# Patient Record
Sex: Female | Born: 1985 | Race: Black or African American | Hispanic: No | Marital: Married | Smoking: Never smoker
Health system: Southern US, Community
[De-identification: ages and names within clinical notes are randomized; demographics above are authoritative.]

## PROBLEM LIST (undated history)

## (undated) DIAGNOSIS — R87629 Unspecified abnormal cytological findings in specimens from vagina: Secondary | ICD-10-CM

## (undated) DIAGNOSIS — Z789 Other specified health status: Secondary | ICD-10-CM

## (undated) HISTORY — PX: NO PAST SURGERIES: SHX2092

## (undated) HISTORY — DX: Unspecified abnormal cytological findings in specimens from vagina: R87.629

## (undated) HISTORY — PX: OTHER SURGICAL HISTORY: SHX169

## (undated) HISTORY — DX: Other specified health status: Z78.9

## (undated) HISTORY — PX: WISDOM TOOTH EXTRACTION: SHX21

---

## 2017-04-06 ENCOUNTER — Encounter: Payer: Self-pay | Admitting: Family Medicine

## 2017-04-07 ENCOUNTER — Ambulatory Visit (INDEPENDENT_AMBULATORY_CARE_PROVIDER_SITE_OTHER): Payer: 59 | Admitting: Physician Assistant

## 2017-04-07 ENCOUNTER — Encounter: Payer: Self-pay | Admitting: Physician Assistant

## 2017-04-07 VITALS — BP 122/73 | HR 85 | Temp 98.3°F | Resp 17 | Ht 66.5 in | Wt 222.1 lb

## 2017-04-07 DIAGNOSIS — Z13228 Encounter for screening for other metabolic disorders: Secondary | ICD-10-CM

## 2017-04-07 DIAGNOSIS — Z13220 Encounter for screening for lipoid disorders: Secondary | ICD-10-CM | POA: Diagnosis not present

## 2017-04-07 DIAGNOSIS — Z803 Family history of malignant neoplasm of breast: Secondary | ICD-10-CM

## 2017-04-07 DIAGNOSIS — Z1329 Encounter for screening for other suspected endocrine disorder: Secondary | ICD-10-CM

## 2017-04-07 DIAGNOSIS — Z130 Encounter for screening for diseases of the blood and blood-forming organs and certain disorders involving the immune mechanism: Secondary | ICD-10-CM

## 2017-04-07 DIAGNOSIS — Z0000 Encounter for general adult medical examination without abnormal findings: Secondary | ICD-10-CM | POA: Diagnosis not present

## 2017-04-07 DIAGNOSIS — Z30011 Encounter for initial prescription of contraceptive pills: Secondary | ICD-10-CM

## 2017-04-07 DIAGNOSIS — Z124 Encounter for screening for malignant neoplasm of cervix: Secondary | ICD-10-CM | POA: Diagnosis not present

## 2017-04-07 LAB — POCT URINALYSIS DIP (MANUAL ENTRY)
Bilirubin, UA: NEGATIVE
Blood, UA: NEGATIVE
Glucose, UA: NEGATIVE mg/dL
Ketones, POC UA: NEGATIVE mg/dL
Leukocytes, UA: NEGATIVE
Nitrite, UA: NEGATIVE
Protein Ur, POC: NEGATIVE mg/dL
Spec Grav, UA: 1.01 (ref 1.010–1.025)
Urobilinogen, UA: 0.2 E.U./dL
pH, UA: 8.5 — AB (ref 5.0–8.0)

## 2017-04-07 LAB — POCT URINE PREGNANCY: Preg Test, Ur: NEGATIVE

## 2017-04-07 MED ORDER — NORETHINDRONE 0.35 MG PO TABS: 1.0000 | ORAL_TABLET | Freq: Every day | ORAL | 0 refills | Status: DC

## 2017-04-07 NOTE — Patient Instructions (Addendum)
Please see if your mother was tested for BRCA1 and BRCA2. I have given you a referral to oncology and they will decide if you need genetic testing and when you should start mammogram screening.   For birth control, start taking micronor tablets daily. You should take these at the same time each day for them to be the most effective. In the meantime, contact your insurance and tell them you are wanting to have the nexplanon placed. Ask them how they go about getting it to your primary care office as we do not have them in stock. They will most likely send it to your pharmacy and then you will pick it up. Once you do this, contact our office and make an appointment with either PA Weber, PA English, or Dr. Nolon Rod to have it placed.   It was nice meeting you today. We will contact you with your lab results and I will fax that sheet to your work. Please sign up for mychart in the meantime so we can communicate through that.    Health Maintenance, Female Adopting a healthy lifestyle and getting preventive care can go a long way to promote health and wellness. Talk with your health care provider about what schedule of regular examinations is right for you. This is a good chance for you to check in with your provider about disease prevention and staying healthy. In between checkups, there are plenty of things you can do on your own. Experts have done a lot of research about which lifestyle changes and preventive measures are most likely to keep you healthy. Ask your health care provider for more information. Weight and diet Eat a healthy diet  Be sure to include plenty of vegetables, fruits, low-fat dairy products, and lean protein.  Do not eat a lot of foods high in solid fats, added sugars, or salt.  Get regular exercise. This is one of the most important things you can do for your health.  Most adults should exercise for at least 150 minutes each week. The exercise should increase your heart rate  and make you sweat (moderate-intensity exercise).  Most adults should also do strengthening exercises at least twice a week. This is in addition to the moderate-intensity exercise. Maintain a healthy weight  Body mass index (BMI) is a measurement that can be used to identify possible weight problems. It estimates body fat based on height and weight. Your health care provider can help determine your BMI and help you achieve or maintain a healthy weight.  For females 91 years of age and older:  A BMI below 18.5 is considered underweight.  A BMI of 18.5 to 24.9 is normal.  A BMI of 25 to 29.9 is considered overweight.  A BMI of 30 and above is considered obese. Watch levels of cholesterol and blood lipids  You should start having your blood tested for lipids and cholesterol at 30 years of age, then have this test every 5 years.  You may need to have your cholesterol levels checked more often if:  Your lipid or cholesterol levels are high.  You are older than 30 years of age.  You are at high risk for heart disease. Cancer screening Lung Cancer  Lung cancer screening is recommended for adults 49-45 years old who are at high risk for lung cancer because of a history of smoking.  A yearly low-dose CT scan of the lungs is recommended for people who:  Currently smoke.  Have quit within the past  15 years.  Have at least a 30-pack-year history of smoking. A pack year is smoking an average of one pack of cigarettes a day for 1 year.  Yearly screening should continue until it has been 15 years since you quit.  Yearly screening should stop if you develop a health problem that would prevent you from having lung cancer treatment. Breast Cancer  Practice breast self-awareness. This means understanding how your breasts normally appear and feel.  It also means doing regular breast self-exams. Let your health care provider know about any changes, no matter how small.  If you are in your  20s or 30s, you should have a clinical breast exam (CBE) by a health care provider every 1-3 years as part of a regular health exam.  If you are 2 or older, have a CBE every year. Also consider having a breast X-ray (mammogram) every year.  If you have a family history of breast cancer, talk to your health care provider about genetic screening.  If you are at high risk for breast cancer, talk to your health care provider about having an MRI and a mammogram every year.  Breast cancer gene (BRCA) assessment is recommended for women who have family members with BRCA-related cancers. BRCA-related cancers include:  Breast.  Ovarian.  Tubal.  Peritoneal cancers.  Results of the assessment will determine the need for genetic counseling and BRCA1 and BRCA2 testing. Cervical Cancer  Your health care provider may recommend that you be screened regularly for cancer of the pelvic organs (ovaries, uterus, and vagina). This screening involves a pelvic examination, including checking for microscopic changes to the surface of your cervix (Pap test). You may be encouraged to have this screening done every 3 years, beginning at age 65.  For women ages 69-65, health care providers may recommend pelvic exams and Pap testing every 3 years, or they may recommend the Pap and pelvic exam, combined with testing for human papilloma virus (HPV), every 5 years. Some types of HPV increase your risk of cervical cancer. Testing for HPV may also be done on women of any age with unclear Pap test results.  Other health care providers may not recommend any screening for nonpregnant women who are considered low risk for pelvic cancer and who do not have symptoms. Ask your health care provider if a screening pelvic exam is right for you.  If you have had past treatment for cervical cancer or a condition that could lead to cancer, you need Pap tests and screening for cancer for at least 20 years after your treatment. If Pap  tests have been discontinued, your risk factors (such as having a new sexual partner) need to be reassessed to determine if screening should resume. Some women have medical problems that increase the chance of getting cervical cancer. In these cases, your health care provider may recommend more frequent screening and Pap tests. Colorectal Cancer  This type of cancer can be detected and often prevented.  Routine colorectal cancer screening usually begins at 30 years of age and continues through 30 years of age.  Your health care provider may recommend screening at an earlier age if you have risk factors for colon cancer.  Your health care provider may also recommend using home test kits to check for hidden blood in the stool.  A small camera at the end of a tube can be used to examine your colon directly (sigmoidoscopy or colonoscopy). This is done to check for the earliest forms of  colorectal cancer.  Routine screening usually begins at age 86.  Direct examination of the colon should be repeated every 5-10 years through 30 years of age. However, you may need to be screened more often if early forms of precancerous polyps or small growths are found. Skin Cancer  Check your skin from head to toe regularly.  Tell your health care provider about any new moles or changes in moles, especially if there is a change in a mole's shape or color.  Also tell your health care provider if you have a mole that is larger than the size of a pencil eraser.  Always use sunscreen. Apply sunscreen liberally and repeatedly throughout the day.  Protect yourself by wearing long sleeves, pants, a wide-brimmed hat, and sunglasses whenever you are outside. Heart disease, diabetes, and high blood pressure  High blood pressure causes heart disease and increases the risk of stroke. High blood pressure is more likely to develop in:  People who have blood pressure in the high end of the normal range (130-139/85-89 mm  Hg).  People who are overweight or obese.  People who are African American.  If you are 67-67 years of age, have your blood pressure checked every 3-5 years. If you are 31 years of age or older, have your blood pressure checked every year. You should have your blood pressure measured twice-once when you are at a hospital or clinic, and once when you are not at a hospital or clinic. Record the average of the two measurements. To check your blood pressure when you are not at a hospital or clinic, you can use:  An automated blood pressure machine at a pharmacy.  A home blood pressure monitor.  If you are between 57 years and 1 years old, ask your health care provider if you should take aspirin to prevent strokes.  Have regular diabetes screenings. This involves taking a blood sample to check your fasting blood sugar level.  If you are at a normal weight and have a low risk for diabetes, have this test once every three years after 30 years of age.  If you are overweight and have a high risk for diabetes, consider being tested at a younger age or more often. Preventing infection Hepatitis B  If you have a higher risk for hepatitis B, you should be screened for this virus. You are considered at high risk for hepatitis B if:  You were born in a country where hepatitis B is common. Ask your health care provider which countries are considered high risk.  Your parents were born in a high-risk country, and you have not been immunized against hepatitis B (hepatitis B vaccine).  You have HIV or AIDS.  You use needles to inject street drugs.  You live with someone who has hepatitis B.  You have had sex with someone who has hepatitis B.  You get hemodialysis treatment.  You take certain medicines for conditions, including cancer, organ transplantation, and autoimmune conditions. Hepatitis C  Blood testing is recommended for:  Everyone born from [birth year] through 1964.  Anyone with known  risk factors for hepatitis C. Sexually transmitted infections (STIs)  You should be screened for sexually transmitted infections (STIs) including gonorrhea and chlamydia if:  You are sexually active and are younger than 30 years of age.  You are older than 30 years of age and your health care provider tells you that you are at risk for this type of infection.  Your sexual activity has  changed since you were last screened and you are at an increased risk for chlamydia or gonorrhea. Ask your health care provider if you are at risk.  If you do not have HIV, but are at risk, it may be recommended that you take a prescription medicine daily to prevent HIV infection. This is called pre-exposure prophylaxis (PrEP). You are considered at risk if:  You are sexually active and do not regularly use condoms or know the HIV status of your partner(s).  You take drugs by injection.  You are sexually active with a partner who has HIV. Talk with your health care provider about whether you are at high risk of being infected with HIV. If you choose to begin PrEP, you should first be tested for HIV. You should then be tested every 3 months for as long as you are taking PrEP. Pregnancy  If you are premenopausal and you may become pregnant, ask your health care provider about preconception counseling.  If you may become pregnant, take 400 to 800 micrograms (mcg) of folic acid every day.  If you want to prevent pregnancy, talk to your health care provider about birth control (contraception). Osteoporosis and menopause  Osteoporosis is a disease in which the bones lose minerals and strength with aging. This can result in serious bone fractures. Your risk for osteoporosis can be identified using a bone density scan.  If you are 68 years of age or older, or if you are at risk for osteoporosis and fractures, ask your health care provider if you should be screened.  Ask your health care provider whether you  should take a calcium or vitamin D supplement to lower your risk for osteoporosis.  Menopause may have certain physical symptoms and risks.  Hormone replacement therapy may reduce some of these symptoms and risks. Talk to your health care provider about whether hormone replacement therapy is right for you. Follow these instructions at home:  Schedule regular health, dental, and eye exams.  Stay current with your immunizations.  Do not use any tobacco products including cigarettes, chewing tobacco, or electronic cigarettes.  If you are pregnant, do not drink alcohol.  If you are breastfeeding, limit how much and how often you drink alcohol.  Limit alcohol intake to no more than 1 drink per day for nonpregnant women. One drink equals 12 ounces of beer, 5 ounces of wine, or 1 ounces of hard liquor.  Do not use street drugs.  Do not share needles.  Ask your health care provider for help if you need support or information about quitting drugs.  Tell your health care provider if you often feel depressed.  Tell your health care provider if you have ever been abused or do not feel safe at home. This information is not intended to replace advice given to you by your health care provider. Make sure you discuss any questions you have with your health care provider. Document Released: 05/15/2011 Document Revised: 04/06/2016 Document Reviewed: 08/03/2015 Elsevier Interactive Patient Education  2017 [employer].   IF you received an x-ray today, you will receive an invoice from [facility]. Please contact [facility] at [phone] with questions or concerns regarding your invoice.   IF you received labwork today, you will receive an invoice from [facility]. Please contact [facility] at [phone] with questions or concerns regarding your invoice.   Our billing staff will not be able to assist you with questions regarding bills from these companies.  You will be  contacted with  the lab results as soon as they are available. The fastest way to get your results is to activate your My Chart account. Instructions are located on the last page of this paperwork. If you have not heard from [place] regarding the results in 2 weeks, please contact this office.

## 2017-04-07 NOTE — Progress Notes (Signed)
Sara Barrett  MRN: [id number] DOB: 1986-05-02  Subjective:  Pt is a 31 y.o. female who presents for annual physical exam.   Soical: Works at [employer]. She just moved here from [place] but is from [facility]. She is engaged and has one 73.50 year old son.  Diet: Has well balanced meal. Eats fruits, veggies, grains, yogurt, and almond milk. Does have a sweet tooth. Drinks mostly water, some juice. Does not take a multiviatmin daily.  Exercise: Walks daily and her job is strenuous but does not do structured exercise frequently as she has been so busy.   Menstrual cycles: LMP 03/21/17. First cycle after her child as she was breastfeeding. Notes she used to take depo before her child. She wants to try nexplanon at this time as she is not planning on getting pregnant at least for 3 years. Denies personal hx clots, strokes, and migraines. Has no FH of strokes or blood clots. Denies smoking.   Bowel movement: Typically goes daily, regular for her.   Last dental exam: 2018 Last vision exam: 2017 Last pap smear: Cannot remember Last mammogram: Never, but would like to discuss when she should start screening mammograms as her mother was dx with breast cancer at age 3. She passed away five years after dx due to MI. Pt is unaware if her mother had genetic testing as she was very young when this occurred. Was told by a doctor in [place] that she would need to start screening 10 years prior to the age her mother was diagnosed.   Vaccinations      Tetanus: 2016      HPV: Completed   There are no active problems to display for this patient.   No current outpatient prescriptions on file prior to visit.   No current facility-administered medications on file prior to visit.     No Known Allergies  Social History   Social History  . Marital status: Single    Spouse name: N/A  . Number of children: N/A  . Years of education: N/A   Social History Main Topics  . Smoking  status: Never Smoker  . Smokeless tobacco: Never Used  . Alcohol use Yes     Comment: rarely  . Drug use: No  . Sexual activity: Yes    Birth control/ protection: None   Other Topics Concern  . None   Social History Narrative  . None    No past surgical history on file.  Family History  Problem Relation Age of Onset  . Cancer Mother 51  . Heart disease Mother   . Diabetes Father   . Diabetes Brother     Review of Systems  Constitutional: Negative for activity change, appetite change, chills, diaphoresis, fatigue, fever and unexpected weight change.  HENT: Positive for congestion. Negative for dental problem, drooling, ear discharge, ear pain, facial swelling, hearing loss, mouth sores, nosebleeds, postnasal drip, rhinorrhea, sinus pain, sinus pressure, sneezing, sore throat, tinnitus, trouble swallowing and voice change.   Eyes: Negative for photophobia, pain, discharge, redness, itching and visual disturbance.  Respiratory: Negative for apnea, cough, choking, chest tightness, shortness of breath, wheezing and stridor.   Cardiovascular: Negative for chest pain, palpitations and leg swelling.  Gastrointestinal: Negative for abdominal distention, abdominal pain, anal bleeding, blood in stool, constipation, diarrhea, nausea, rectal pain and vomiting.  Endocrine: Negative for cold intolerance, heat intolerance, polydipsia, polyphagia and polyuria.  Genitourinary: Negative for decreased urine volume, difficulty urinating, dyspareunia, dysuria, enuresis, flank pain,  frequency, genital sores, hematuria, menstrual problem, pelvic pain, urgency, vaginal bleeding, vaginal discharge and vaginal pain.  Musculoskeletal: Negative for arthralgias, back pain, gait problem, joint swelling, myalgias, neck pain and neck stiffness.  Skin: Negative for color change, pallor, rash and wound.  Allergic/Immunologic: Negative for environmental allergies, food allergies and immunocompromised state.    Neurological: Negative for dizziness, tremors, seizures, syncope, facial asymmetry, speech difficulty, weakness, light-headedness, numbness and headaches.  Hematological: Negative for adenopathy. Does not bruise/bleed easily.  Psychiatric/Behavioral: Negative for agitation, behavioral problems, confusion, decreased concentration, dysphoric mood, hallucinations, self-injury, sleep disturbance and suicidal ideas. The patient is not nervous/anxious and is not hyperactive.     Objective:  BP 122/73   Pulse 85   Temp 98.3 F (36.8 C) (Oral)   Resp 17   Ht 5' 6.5" (1.689 m)   Wt 222 lb 2 oz (100.8 kg)   LMP 03/21/2017 (Exact Date)   SpO2 100%   BMI 35.31 kg/m   Physical Exam  Constitutional: She is oriented to person, place, and time and well-developed, well-nourished, and in no distress.  HENT:  Head: Normocephalic and atraumatic.  Right Ear: Hearing, tympanic membrane, external ear and ear canal normal.  Left Ear: Hearing, tympanic membrane, external ear and ear canal normal.  Nose: Nose normal.  Mouth/Throat: Uvula is midline, oropharynx is clear and moist and mucous membranes are normal. No oropharyngeal exudate.  Eyes: Conjunctivae, EOM and lids are normal. Pupils are equal, round, and reactive to light. No scleral icterus.  Neck: Trachea normal and normal range of motion. No thyroid mass and no thyromegaly present.  Cardiovascular: Normal rate, regular rhythm, normal heart sounds and intact distal pulses.   Pulmonary/Chest: Effort normal and breath sounds normal. Right breast exhibits no inverted nipple, no mass, no nipple discharge, no skin change and no tenderness. Left breast exhibits no inverted nipple, no mass, no nipple discharge, no skin change and no tenderness. Breasts are symmetrical.  Abdominal: Soft. Normal appearance and bowel sounds are normal. There is no tenderness.  Genitourinary: Vagina normal, uterus normal, cervix normal, right adnexa normal, left adnexa normal and  vulva normal.  Lymphadenopathy:       Head (right side): No tonsillar, no preauricular, no posterior auricular and no occipital adenopathy present.       Head (left side): No tonsillar, no preauricular, no posterior auricular and no occipital adenopathy present.    She has no cervical adenopathy.       Right: No supraclavicular adenopathy present.       Left: No supraclavicular adenopathy present.  Neurological: She is alert and oriented to person, place, and time. She has normal sensation, normal strength and normal reflexes. Gait normal.  Skin: Skin is warm and dry.  Psychiatric: Affect normal.    Visual Acuity Screening   Right eye Left eye Both eyes  Without correction:     With correction: '20/15 20/20 20/15 '   Results for orders placed or performed in visit on 04/07/17 (from the past 48 hour(s))  CBC with Differential/Platelet     Status: None   Collection Time: 04/07/17 10:21 AM  Result Value Ref Range   WBC 5.7 3.4 - 10.8 x10E3/uL   RBC 4.80 3.77 - 5.28 x10E6/uL   Hemoglobin 13.5 11.1 - 15.9 g/dL   Hematocrit 42.2 34.0 - 46.6 %   MCV 88 79 - 97 fL   MCH 28.1 26.6 - 33.0 pg   MCHC 32.0 31.5 - 35.7 g/dL   RDW 13.1 12.3 -  15.4 %   Platelets 249 150 - 379 x10E3/uL   Neutrophils 64 Not Estab. %   Lymphs 27 Not Estab. %   Monocytes 8 Not Estab. %   Eos 1 Not Estab. %   Basos 0 Not Estab. %   Neutrophils Absolute 3.7 1.4 - 7.0 x10E3/uL   Lymphocytes Absolute 1.5 0.7 - 3.1 x10E3/uL   Monocytes Absolute 0.5 0.1 - 0.9 x10E3/uL   EOS (ABSOLUTE) 0.0 0.0 - 0.4 x10E3/uL   Basophils Absolute 0.0 0.0 - 0.2 x10E3/uL   Immature Granulocytes 0 Not Estab. %   Immature Grans (Abs) 0.0 0.0 - 0.1 x10E3/uL  CMP14+EGFR     Status: None   Collection Time: 04/07/17 10:21 AM  Result Value Ref Range   Glucose 80 65 - 99 mg/dL   BUN 8 6 - 20 mg/dL   Creatinine, Ser 0.79 0.57 - 1.00 mg/dL   GFR calc non Af Amer 101 >59 mL/min/1.73   GFR calc Af Amer 117 >59 mL/min/1.73   BUN/Creatinine Ratio  10 9 - 23   Sodium 138 134 - 144 mmol/L   Potassium 4.7 3.5 - 5.2 mmol/L   Chloride 103 96 - 106 mmol/L   CO2 23 18 - 29 mmol/L    Comment: **Effective April 23, 2017 Carbon Dioxide, Total**   reference interval will be changing to:              Age                  Female          Female      0 days   - 30 days         13 - 52        16 - 63     31 days   -  1 year         15 - 25        15 - 25      2 years  -  5 years        60 - 80        17 - 91      6 years  - 12 years        57 - 28        19 - 42                >12 years        32 - 92        20 - 29    Calcium 9.1 8.7 - 10.2 mg/dL   Total Protein 6.7 6.0 - 8.5 g/dL   Albumin 4.3 3.5 - 5.5 g/dL   Globulin, Total 2.4 1.5 - 4.5 g/dL   Albumin/Globulin Ratio 1.8 1.2 - 2.2   Bilirubin Total 0.4 0.0 - 1.2 mg/dL   Alkaline Phosphatase 70 39 - 117 IU/L   AST 21 0 - 40 IU/L   ALT 19 0 - 32 IU/L  Lipid panel     Status: None   Collection Time: 04/07/17 10:21 AM  Result Value Ref Range   Cholesterol, Total 127 100 - 199 mg/dL   Triglycerides 43 0 - 149 mg/dL   HDL 58 >39 mg/dL   VLDL Cholesterol Cal 9 5 - 40 mg/dL   LDL Calculated 60 0 - 99 mg/dL   Chol/HDL Ratio 2.2 0.0 - 4.4 ratio    Comment:  T. Chol/HDL Ratio                                             Men  Women                               1/2 Avg.Risk  3.4    3.3                                   Avg.Risk  5.0    4.4                                2X Avg.Risk  9.6    7.1                                3X Avg.Risk 23.4   11.0   TSH     Status: None   Collection Time: 04/07/17 10:21 AM  Result Value Ref Range   TSH 1.120 0.450 - 4.500 uIU/mL  POCT urine pregnancy     Status: None   Collection Time: 04/07/17 11:10 AM  Result Value Ref Range   Preg Test, Ur Negative Negative  POCT urinalysis dipstick     Status: Abnormal   Collection Time: 04/07/17 11:10 AM  Result Value Ref Range   Color, UA yellow yellow   Clarity, UA clear clear    Glucose, UA negative negative mg/dL   Bilirubin, UA negative negative   Ketones, POC UA negative negative mg/dL   Spec Grav, UA 1.010 1.010 - 1.025   Blood, UA negative negative   pH, UA 8.5 (A) 5.0 - 8.0   Protein Ur, POC negative negative mg/dL   Urobilinogen, UA 0.2 0.2 or 1.0 E.U./dL   Nitrite, UA Negative Negative   Leukocytes, UA Negative Negative     Assessment and Plan :  Discussed healthy lifestyle, diet, exercise, preventative care, vaccinations, and addressed patient's concerns. Plan for follow up for nexplanon placement. Otherwise, plan for specific conditions below.  1. Annual physical exam Await lab results   2. Screening, anemia, deficiency, iron - CBC with Differential/Platelet  3. Screening for endocrine, metabolic and immunity disorder - CMP14+EGFR  4. Screening, lipid - Lipid panel  5. Screening for thyroid disorder - TSH  6. Screening for cervical cancer - Pap IG, CT/NG w/ reflex HPV when ASC-U  7. Family history of breast cancer in mother - Ambulatory referral to Oncology  8. Oral contraception initiation Pt informed on how to go about getting the nexplanon placed in our office. She will have to contact her insurance and let them know she is wanting the nexplanon. Once she receives it, she will make an appointment in our office for nexplanon placement. I have given her 3 months of Rx for OCP to take while going through this process. Encouraged to take tablets daily at the same time for maximum effect.  - norethindrone (ORTHO MICRONOR) 0.35 MG tablet; Take 1 tablet (0.35 mg total) by mouth daily.  Dispense: 3 Package; Refill: 0 - POCT urine pregnancy - POCT urinalysis dipstick   Tenna Delaine PA-C  [facility] 04/08/2017 11:12 AM

## 2017-04-08 LAB — LIPID PANEL
Chol/HDL Ratio: 2.2 ratio (ref 0.0–4.4)
Cholesterol, Total: 127 mg/dL (ref 100–199)
HDL: 58 mg/dL (ref >39)
LDL Calculated: 60 mg/dL (ref 0–99)
Triglycerides: 43 mg/dL (ref 0–149)
VLDL Cholesterol Cal: 9 mg/dL (ref 5–40)

## 2017-04-08 LAB — CMP14+EGFR
ALT: 19 IU/L (ref 0–32)
AST: 21 IU/L (ref 0–40)
Albumin/Globulin Ratio: 1.8 (ref 1.2–2.2)
Albumin: 4.3 g/dL (ref 3.5–5.5)
Alkaline Phosphatase: 70 IU/L (ref 39–117)
BUN/Creatinine Ratio: 10 (ref 9–23)
BUN: 8 mg/dL (ref 6–20)
Bilirubin Total: 0.4 mg/dL (ref 0.0–1.2)
CO2: 23 mmol/L (ref 18–29)
Calcium: 9.1 mg/dL (ref 8.7–10.2)
Chloride: 103 mmol/L (ref 96–106)
Creatinine, Ser: 0.79 mg/dL (ref 0.57–1.00)
GFR calc Af Amer: 117 mL/min/{1.73_m2} (ref >59)
GFR calc non Af Amer: 101 mL/min/{1.73_m2} (ref >59)
Globulin, Total: 2.4 g/dL (ref 1.5–4.5)
Glucose: 80 mg/dL (ref 65–99)
Potassium: 4.7 mmol/L (ref 3.5–5.2)
Sodium: 138 mmol/L (ref 134–144)
Total Protein: 6.7 g/dL (ref 6.0–8.5)

## 2017-04-08 LAB — CBC WITH DIFFERENTIAL/PLATELET
Basophils Absolute: 0 10*3/uL (ref 0.0–0.2)
Basos: 0 %
EOS (ABSOLUTE): 0 10*3/uL (ref 0.0–0.4)
Eos: 1 %
Hematocrit: 42.2 % (ref 34.0–46.6)
Hemoglobin: 13.5 g/dL (ref 11.1–15.9)
Immature Grans (Abs): 0 10*3/uL (ref 0.0–0.1)
Immature Granulocytes: 0 %
Lymphocytes Absolute: 1.5 10*3/uL (ref 0.7–3.1)
Lymphs: 27 %
MCH: 28.1 pg (ref 26.6–33.0)
MCHC: 32 g/dL (ref 31.5–35.7)
MCV: 88 fL (ref 79–97)
Monocytes Absolute: 0.5 10*3/uL (ref 0.1–0.9)
Monocytes: 8 %
Neutrophils Absolute: 3.7 10*3/uL (ref 1.4–7.0)
Neutrophils: 64 %
Platelets: 249 10*3/uL (ref 150–379)
RBC: 4.8 x10E6/uL (ref 3.77–5.28)
RDW: 13.1 % (ref 12.3–15.4)
WBC: 5.7 10*3/uL (ref 3.4–10.8)

## 2017-04-08 LAB — TSH: TSH: 1.12 u[IU]/mL (ref 0.450–4.500)

## 2017-04-12 LAB — PAP IG, CT-NG, RFX HPV ASCU
Chlamydia, Nuc. Acid Amp: NEGATIVE
Gonococcus by Nucleic Acid Amp: NEGATIVE
PAP Smear Comment: 0

## 2017-04-19 ENCOUNTER — Telehealth: Payer: Self-pay | Admitting: Physician Assistant

## 2017-04-19 NOTE — Telephone Encounter (Signed)
Informed patient that I have completed biometric form and placed it in the "to be faxed" box.

## 2017-05-21 ENCOUNTER — Telehealth: Payer: Self-pay | Admitting: Genetic Counselor

## 2017-05-21 ENCOUNTER — Encounter: Payer: Self-pay | Admitting: Genetic Counselor

## 2017-05-21 NOTE — Telephone Encounter (Signed)
Appt has been scheduled for the pt to see Clydie BraunKaren on 7/23 at 10am. Pt agreed to the appt date and time. Letter mailed to the pt.

## 2017-06-04 ENCOUNTER — Encounter: Payer: Self-pay | Admitting: Genetic Counselor

## 2017-06-04 ENCOUNTER — Other Ambulatory Visit: Payer: Self-pay

## 2018-06-13 DIAGNOSIS — Z0000 Encounter for general adult medical examination without abnormal findings: Secondary | ICD-10-CM | POA: Insufficient documentation

## 2018-06-19 DIAGNOSIS — Z1231 Encounter for screening mammogram for malignant neoplasm of breast: Secondary | ICD-10-CM | POA: Insufficient documentation

## 2020-11-10 ENCOUNTER — Telehealth: Payer: Self-pay

## 2020-11-10 ENCOUNTER — Encounter: Payer: Self-pay | Admitting: Advanced Practice Midwife

## 2020-11-10 ENCOUNTER — Other Ambulatory Visit: Payer: Self-pay

## 2020-11-10 ENCOUNTER — Ambulatory Visit (LOCAL_COMMUNITY_HEALTH_CENTER): Payer: Self-pay | Admitting: Family Medicine

## 2020-11-10 VITALS — BP 115/70 | HR 85 | Ht 66.5 in | Wt 241.8 lb

## 2020-11-10 DIAGNOSIS — Z3009 Encounter for other general counseling and advice on contraception: Secondary | ICD-10-CM

## 2020-11-10 DIAGNOSIS — Z3046 Encounter for surveillance of implantable subdermal contraceptive: Secondary | ICD-10-CM

## 2020-11-10 NOTE — Telephone Encounter (Signed)
Call to client ~ 1300 and left message requesting she return call regarding appt this pm. Client returned call and counseled that physical recommended as no physical previously at [facility] (which she verified). Per client, physical declined and only wants Nexplanon removed as desires pregnancy. Client states has card from 2018 when Nexplanon was inserted and counseled to bring to appt. Client asked if physical required for Nexplanon removal and aware may decline physical. Jossie Ng, RN

## 2020-11-10 NOTE — Progress Notes (Signed)
S: patient in clinic today for nexplanon removal, declines physical exam, lab work and pap.  Patient is desiring pregnancy   O: Nexplanon was placed in 2018   A: Nexplanon to be removed and counseled patient to start prenatal vitamins.   P: Nexplanon Removal Patient identified, informed consent performed, consent signed.   Appropriate time out taken. Nexplanon site identified.  Area prepped in usual sterile fashon. 3 ml of 1% lidocaine with Epinephrine was used to anesthetize the area at the distal end of the implant and along implant site. A small stab incision was made right beside the implant on the distal portion.  The Nexplanon rod was grasped using straight hemostats and removed without difficulty.  There was minimal blood loss. There were no complications.  Steri-strips were applied over the small incision.  A pressure bandage was applied to reduce any bruising.  The patient tolerated the procedure well and was given post procedure instructions.    Counseled patient to take OTC analgesic starting as soon as lidocaine starts to wear off and take regularly for at least 48 hr to decrease discomfort.  Specifically to take with food or milk to decrease stomach upset and for IB 600 mg (3 tablets) every 6 hrs; IB 800 mg (4 tablets) every 8 hrs; or Aleve 2 tablets every 12 hrs.

## 2020-11-10 NOTE — Progress Notes (Signed)
Completion of health history form and physical exam declined. Client declines birth control as desires pregnancy. Condoms given are not for client. Jossie Ng, RN

## 2020-11-13 NOTE — L&D Delivery Note (Signed)
[facility] Delivery Note  Sara Barrett is a 35 y.o. G2P1001 s/p SVD at [redacted]w[redacted]d. She was admitted for SOL.   ROM: 0h 17m with moderate meconium fluid GBS Status: Positive/-- (11/07 0000) Inadequate treatment with precipitous delivery.  Maximum Maternal Temperature: 69F  Labor Progress: Initial SVE: 6.5/90/-2. She then progressed quickly to complete after about one hour with expectant management.     Delivery Date/Time: 11/27 at 2237  Delivery: Called by RN to be standby when patient was anterior lip with Dr. Cherly Hensen on her way. Upon arrival to the room at ~2230, patient had SROM with moderate meconium stained fluid (delivery call made) and then involuntarily pushed. After one push, head delivered L OA. Loose nuchal cord present x1 and delivered through. Shoulder and body delivered in usual fashion. Infant placed on mother's abdomen, dried, suctioned with bulb, and stimulated. Infant then given to awaiting NICU team. Cord clamped x 2 after 1-minute delay, and cut by FOB. Cord blood drawn. Placenta delivered spontaneously with gentle cord traction. Fundus firm with massage and Pitocin. Labia, perineum, vagina, and cervix inspected with no lacerations. Dr. Cherly Hensen arrived after placenta delivery, performed a lower uterine manual sweep with minimal clots retrieved.    Baby Weight: pending  Placenta: 3 vessel, intact. Sent to L&D Complications: None Lacerations: None  EBL: 50 mL Analgesia: None   Infant:  APGAR (1 MIN): 8   APGAR (5 MINS): 9    Leticia Penna, DO  OB Family Medicine Fellow, [facility] for [facility], [facility] 10/09/2021, 10:56 PM

## 2021-03-01 ENCOUNTER — Other Ambulatory Visit: Payer: Self-pay | Admitting: Obstetrics and Gynecology

## 2021-03-01 DIAGNOSIS — O3680X0 Pregnancy with inconclusive fetal viability, not applicable or unspecified: Secondary | ICD-10-CM

## 2021-03-21 ENCOUNTER — Ambulatory Visit
Admission: RE | Admit: 2021-03-21 | Discharge: 2021-03-21 | Disposition: A | Discharge status: Home / Self Care | Payer: Self-pay | Source: Ambulatory Visit | Attending: Obstetrics and Gynecology | Admitting: Obstetrics and Gynecology

## 2021-03-21 ENCOUNTER — Other Ambulatory Visit: Payer: Self-pay | Admitting: Obstetrics and Gynecology

## 2021-03-21 ENCOUNTER — Other Ambulatory Visit: Payer: Self-pay

## 2021-03-21 DIAGNOSIS — O3680X0 Pregnancy with inconclusive fetal viability, not applicable or unspecified: Secondary | ICD-10-CM

## 2021-03-30 LAB — HEPATITIS C ANTIBODY: HCV Ab: NEGATIVE

## 2021-03-30 LAB — OB RESULTS CONSOLE HEPATITIS B SURFACE ANTIGEN: Hepatitis B Surface Ag: NEGATIVE

## 2021-03-30 LAB — OB RESULTS CONSOLE HIV ANTIBODY (ROUTINE TESTING): HIV: NONREACTIVE

## 2021-03-30 LAB — OB RESULTS CONSOLE RUBELLA ANTIBODY, IGM: Rubella: IMMUNE

## 2021-05-04 LAB — OB RESULTS CONSOLE ABO/RH: RH Type: POSITIVE

## 2021-05-04 LAB — OB RESULTS CONSOLE ANTIBODY SCREEN: Antibody Screen: NEGATIVE

## 2021-06-09 ENCOUNTER — Encounter: Payer: Self-pay | Admitting: Obstetrics and Gynecology

## 2021-07-01 ENCOUNTER — Encounter: Payer: Self-pay | Admitting: *Deleted

## 2021-07-04 ENCOUNTER — Other Ambulatory Visit: Payer: Self-pay | Admitting: Obstetrics and Gynecology

## 2021-07-04 DIAGNOSIS — Z363 Encounter for antenatal screening for malformations: Secondary | ICD-10-CM

## 2021-07-05 ENCOUNTER — Encounter: Payer: Self-pay | Admitting: *Deleted

## 2021-07-05 ENCOUNTER — Other Ambulatory Visit: Payer: Self-pay | Admitting: Maternal & Fetal Medicine

## 2021-07-05 ENCOUNTER — Ambulatory Visit (HOSPITAL_BASED_OUTPATIENT_CLINIC_OR_DEPARTMENT_OTHER): Payer: Self-pay

## 2021-07-05 ENCOUNTER — Other Ambulatory Visit: Payer: Self-pay

## 2021-07-05 ENCOUNTER — Ambulatory Visit: Payer: Self-pay | Attending: Obstetrics and Gynecology

## 2021-07-05 ENCOUNTER — Ambulatory Visit: Payer: Self-pay | Admitting: *Deleted

## 2021-07-05 VITALS — BP 127/73 | HR 99

## 2021-07-05 DIAGNOSIS — O358XX0 Maternal care for other (suspected) fetal abnormality and damage, not applicable or unspecified: Secondary | ICD-10-CM

## 2021-07-05 DIAGNOSIS — O99212 Obesity complicating pregnancy, second trimester: Secondary | ICD-10-CM

## 2021-07-05 DIAGNOSIS — Z362 Encounter for other antenatal screening follow-up: Secondary | ICD-10-CM

## 2021-07-05 DIAGNOSIS — Z3689 Encounter for other specified antenatal screening: Secondary | ICD-10-CM

## 2021-07-05 DIAGNOSIS — O35BXX0 Maternal care for other (suspected) fetal abnormality and damage, fetal cardiac anomalies, not applicable or unspecified: Secondary | ICD-10-CM

## 2021-07-05 DIAGNOSIS — O289 Unspecified abnormal findings on antenatal screening of mother: Secondary | ICD-10-CM

## 2021-07-05 DIAGNOSIS — Z315 Encounter for genetic counseling: Secondary | ICD-10-CM

## 2021-07-05 DIAGNOSIS — Z3A24 24 weeks gestation of pregnancy: Secondary | ICD-10-CM

## 2021-07-05 DIAGNOSIS — Z363 Encounter for antenatal screening for malformations: Secondary | ICD-10-CM | POA: Insufficient documentation

## 2021-07-05 NOTE — Progress Notes (Signed)
Cousins, Sheronette Length of Consultation: 40 minutes   Ms. Stegner was referred for genetic counseling at [facility].  The patient was present at this visit alone, but her partner, LeDarius, was present via speaker phone for most of the visit. This letter is a summary of our discussion.  At the time of this visit, a detailed ultrasound was performed.  The gestational age was confirmed to be 24 weeks.  The detailed fetal anatomy was seen and appeared normal, including the structures of the fetal heart.  An echogenic intracardiac focus was noted in the left ventricle of the heart.  The choroid plexus cysts that were reported by her OB were not noted on today's exam.  An echogenic intracardiac focus (EIF) refers to a bright spot in the fetal heart.  They are usually found in the left ventricle, which is one of the lower chambers of the heart.  This finding is thought to occur in at least 3-5% of second trimester ultrasounds as a result of microcalcifications in the papillary muscle of the heart.  While it is most likely a normal variation, some studies have found an association between this finding and chromosome abnormalities.  The current data suggests that as an isolated ultrasound finding, the chance for a chromosome problem is not expected to be higher than the risk from an amniocentesis (1 in 200).    In addition to the EIF, choroid plexus cysts were previously reported.  The choroid plexus is an area in the brain where cerebral spinal fluid, the fluid that bathes the brain and spinal cord, is made. Cysts, or fluid filled sacs, are sometimes found in the choroid plexus of babies both before and after they are born. These cysts are referred to as choroid plexus cysts (CPC).  Some literature suggests that as many as 1 in 50 fetuses (2%) evaluated by ultrasound will show these cysts. The significance of these cysts remains unclear, although it is believed that most are a normal variation of  development. Many studies have been done to see if choroid plexus cysts seen on prenatal ultrasound increase the concern for other problems in the baby. After evaluating these studies, it appears that there may be an increase in the risk for a chromosome condition called trisomy 5.  However, this increase in risk is more significant if other ultrasound markers of chromosome condition are seen, or if a birth defect is identified by ultrasound. The increase in risk is less significant if the choroid plexus cysts are an "isolated" finding at ultrasound.  In the absence of a chromosome condition, choroid plexus cysts are not expected to be harmful to the fetus.    To review, chromosomes are the inherited structures that contain our genes (traits).  Each cell of our body normally has 46 chromosomes, matched up in 23 pairs.  The last pair determines gender and are called the sex chromosomes.  A female has an X and a Y chromosome, while a female has two X chromosomes.  Rarely, when a mother's egg or father's sperm unite, an extra or missing chromosome can be passed on to the baby by mistake.  We discussed examples of such a problem including: Down syndrome (an extra 92) and Edward syndrome (an extra 18), both involving some degree of mental retardation and physical problems.  Before this ultrasound, there was a 1 in approximately 244 chance of having a baby with a chromosome problem based on Ms. Perrone's age.  Now that the echogenic  focus and choroid plexus cyst(s) have been seen, the risk has increased, though exactly by how much is difficult to determine.    We discussed the following prenatal testing options for this pregnancy:  Cell free fetal DNA testing from maternal blood to determine whether or not the baby may have Down syndrome, trisomy 23, or trisomy 54.  This test utilizes a maternal blood sample and DNA sequencing technology to isolate circulating cell free fetal DNA from maternal plasma.  The fetal DNA  can then be analyzed for DNA sequences that are derived from the three most common chromosomes involved in aneuploidy, chromosomes 13, 18, and 21.  If the overall amount of DNA is greater than the expected level for any of these chromosomes, aneuploidy is suspected.  While we do not consider it a replacement for invasive testing and karyotype analysis, a negative result from this testing would be reassuring, though not a guarantee of a normal chromosome complement for the baby.  An abnormal result is certainly suggestive of an abnormal chromosome complement, though we would still recommend amniocentesis to confirm any findings from this testing.  Maternal Serum Screening (MSS) is a maternal blood test which measures up to four pregnancy proteins.  Currently, this screening test can detect approximately 75% of cases of Down syndrome, 65% of babies with Ramon Dredge syndrome and >80% of neural tube defects (e.g. spina bifida).  Because it does not directly examine the fetal chromosomes or fetus itself, it cannot positively diagnose or rule out these problems.  Amniocentesis involves the removal of a small amount of amniotic fluid from the sac surrounding the fetus with the use of a thin needle inserted through the mother's abdomen and uterus.  Ultrasound guidance is used throughout the procedure.  Fetal cells are directly evaluated and >99.5% of chromosome problems and >98% of neural tube defects can be detected.  The main risks to this procedure are complications leading to miscarriage in less than 1 in 500 cases (0.2%).  Ultrasound can sometimes detect other fetal anatomy problems (e.g. a heart defect) that suggest a chromosome problem; however, this is often not the case.  If the remainder of the fetal anatomy was seen well today, we do not recommend additional imaging of the fetal heart in the future.  Lastly, we mentioned that each pregnancy in the general population has a 2-3% chance for a birth defect that  might not be detected prenatally.  Cystic Fibrosis and Spinal Muscular Atrophy (SMA) screening were also discussed with the patient. Both conditions are recessive, which means that both parents must be carriers in order to have a child with the disease.  Cystic fibrosis (CF) is one of the most common genetic conditions in persons of Caucasian ancestry.  This condition occurs in approximately 1 in 2,500 Caucasian persons and results in thickened secretions in the lungs, digestive, and reproductive systems.  For a baby to be at risk for having CF, both of the parents must be carriers for this condition.  Approximately 1 in 57 Caucasian persons is a carrier for CF.  Current carrier testing looks for the most common mutations in the gene for CF and can detect approximately 90% of carriers in the Caucasian population.  This means that the carrier screening can greatly reduce, but cannot eliminate, the chance for an individual to have a child with CF.  If an individual is found to be a carrier for CF, then carrier testing would be available for the partner. As part of [place]'s newborn screening profile, all babies born in the state of [place] will have a two-tier screening process.  Specimens are first tested to determine the concentration of immunoreactive trypsinogen (IRT).  The top 5% of specimens with the highest IRT values then undergo DNA testing using a panel of over 40 common CF mutations. SMA is a neurodegenerative disorder that leads to atrophy of skeletal muscle and overall weakness.  This condition is also more prevalent in the Caucasian population, with 1 in 40-1 in 60 persons being a carrier and 1 in 6,000-1 in 10,000 children being affected.  There are multiple forms of the disease, with some causing death in infancy to other forms with survival into adulthood.  The genetics of SMA is complex, but carrier screening can detect up to 95% of carriers in the Caucasian population.  Similar to  CF, a negative result can greatly reduce, but cannot eliminate, the chance to have a child with SMA. Hemoglobinopathy screening was previously performed by her OB and was normal.  We obtained a detailed family history.  The patient reported a paternal uncle who had a son with hydrocephaly.  He passed away at 34 years old, though no details are known about his condition or the cause for his death.  Because there may be various reasons for hydrocephaly including known genetic conditions as well as environmental factors, we would need additional information to provide an accurate risk assessment for other family members.  She also reported a maternal aunt and her children with an eye condition.  One eye appears small, but they do not wear glasses or have other known health concerns.  More details about the diagnosis are needed to determine the level of concern for this pregnancy or other relatives.  Similarly, the father of the baby's sister had a stillborn baby.  She has five other children who are in good health.  It is not known if that baby had any birth differences or if there were any known complications in that pregnancy.  If more is learned, we are happy to review that information further. The remainder of the family history was reported to be unremarkable for birth defects, developmental delays, recurrent loss or known chromosome abnormalities.  This is the second pregnancy for this couple.  Ms. Veno denied any pregnancy complications, as well as any recreational drug, alcohol or tobacco use in pregnancy.  Plan of care: Ms. Browning declined NIPS and amniocentesis. Follow up ultrasound scheduled in 4 weeks. Declined carrier screening for CF and SMA.  The patient was encouraged to call with questions or concerns.  We can be contacted at [phone].   Cherly Anderson, MS, CGC

## 2021-08-02 ENCOUNTER — Ambulatory Visit: Payer: Self-pay | Attending: Maternal & Fetal Medicine

## 2021-08-02 ENCOUNTER — Other Ambulatory Visit: Payer: Self-pay

## 2021-08-02 ENCOUNTER — Encounter: Payer: Self-pay | Admitting: *Deleted

## 2021-08-02 ENCOUNTER — Ambulatory Visit: Payer: Self-pay | Admitting: *Deleted

## 2021-08-02 ENCOUNTER — Other Ambulatory Visit: Payer: Self-pay | Admitting: *Deleted

## 2021-08-02 VITALS — BP 115/61 | HR 72

## 2021-08-02 DIAGNOSIS — Z362 Encounter for other antenatal screening follow-up: Secondary | ICD-10-CM | POA: Insufficient documentation

## 2021-08-02 DIAGNOSIS — Z3689 Encounter for other specified antenatal screening: Secondary | ICD-10-CM

## 2021-08-02 DIAGNOSIS — O283 Abnormal ultrasonic finding on antenatal screening of mother: Secondary | ICD-10-CM

## 2021-08-02 DIAGNOSIS — O289 Unspecified abnormal findings on antenatal screening of mother: Secondary | ICD-10-CM | POA: Insufficient documentation

## 2021-08-02 DIAGNOSIS — Z3A28 28 weeks gestation of pregnancy: Secondary | ICD-10-CM

## 2021-08-02 DIAGNOSIS — O350XX0 Maternal care for (suspected) central nervous system malformation in fetus, not applicable or unspecified: Secondary | ICD-10-CM

## 2021-08-02 DIAGNOSIS — O09523 Supervision of elderly multigravida, third trimester: Secondary | ICD-10-CM

## 2021-08-02 DIAGNOSIS — O99212 Obesity complicating pregnancy, second trimester: Secondary | ICD-10-CM | POA: Insufficient documentation

## 2021-08-02 DIAGNOSIS — O3503X0 Maternal care for (suspected) central nervous system malformation or damage in fetus, choroid plexus cysts, not applicable or unspecified: Secondary | ICD-10-CM

## 2021-08-02 DIAGNOSIS — Z6837 Body mass index (BMI) 37.0-37.9, adult: Secondary | ICD-10-CM

## 2021-08-02 DIAGNOSIS — E669 Obesity, unspecified: Secondary | ICD-10-CM

## 2021-08-02 LAB — HEPATITIS C ANTIBODY: HCV Ab: NEGATIVE

## 2021-08-02 LAB — OB RESULTS CONSOLE RPR
RPR: NONREACTIVE
RPR: NONREACTIVE

## 2021-08-30 ENCOUNTER — Other Ambulatory Visit: Payer: Self-pay

## 2021-08-30 ENCOUNTER — Encounter: Payer: Self-pay | Admitting: *Deleted

## 2021-08-30 ENCOUNTER — Ambulatory Visit: Payer: Self-pay | Attending: Obstetrics

## 2021-08-30 ENCOUNTER — Ambulatory Visit: Payer: Self-pay | Admitting: *Deleted

## 2021-08-30 VITALS — BP 120/71 | HR 85

## 2021-08-30 DIAGNOSIS — O09523 Supervision of elderly multigravida, third trimester: Secondary | ICD-10-CM | POA: Insufficient documentation

## 2021-08-30 DIAGNOSIS — Z3689 Encounter for other specified antenatal screening: Secondary | ICD-10-CM | POA: Insufficient documentation

## 2021-08-30 DIAGNOSIS — Z6837 Body mass index (BMI) 37.0-37.9, adult: Secondary | ICD-10-CM | POA: Insufficient documentation

## 2021-08-30 DIAGNOSIS — Z362 Encounter for other antenatal screening follow-up: Secondary | ICD-10-CM

## 2021-08-30 DIAGNOSIS — O3503X0 Maternal care for (suspected) central nervous system malformation or damage in fetus, choroid plexus cysts, not applicable or unspecified: Secondary | ICD-10-CM | POA: Insufficient documentation

## 2021-08-30 DIAGNOSIS — Z3A32 32 weeks gestation of pregnancy: Secondary | ICD-10-CM

## 2021-08-30 DIAGNOSIS — O283 Abnormal ultrasonic finding on antenatal screening of mother: Secondary | ICD-10-CM | POA: Insufficient documentation

## 2021-09-12 ENCOUNTER — Ambulatory Visit: Payer: Self-pay

## 2021-09-19 LAB — OB RESULTS CONSOLE GBS: GBS: POSITIVE

## 2021-10-09 ENCOUNTER — Encounter (HOSPITAL_COMMUNITY): Payer: Self-pay | Admitting: Obstetrics and Gynecology

## 2021-10-09 ENCOUNTER — Inpatient Hospital Stay (HOSPITAL_COMMUNITY)
Admission: AD | Admit: 2021-10-09 | Discharge: 2021-10-11 | DRG: 807 | Disposition: A | Discharge status: Home / Self Care | Payer: Self-pay | Attending: Obstetrics and Gynecology | Admitting: Obstetrics and Gynecology

## 2021-10-09 ENCOUNTER — Other Ambulatory Visit: Payer: Self-pay

## 2021-10-09 DIAGNOSIS — Z3A38 38 weeks gestation of pregnancy: Secondary | ICD-10-CM

## 2021-10-09 DIAGNOSIS — J111 Influenza due to unidentified influenza virus with other respiratory manifestations: Secondary | ICD-10-CM | POA: Diagnosis present

## 2021-10-09 DIAGNOSIS — O99824 Streptococcus B carrier state complicating childbirth: Secondary | ICD-10-CM | POA: Diagnosis present

## 2021-10-09 DIAGNOSIS — Z20822 Contact with and (suspected) exposure to covid-19: Secondary | ICD-10-CM | POA: Diagnosis present

## 2021-10-09 DIAGNOSIS — O9952 Diseases of the respiratory system complicating childbirth: Secondary | ICD-10-CM | POA: Diagnosis present

## 2021-10-09 DIAGNOSIS — O4202 Full-term premature rupture of membranes, onset of labor within 24 hours of rupture: Secondary | ICD-10-CM

## 2021-10-09 LAB — CBC
HCT: 43.4 % (ref 36.0–46.0)
Hemoglobin: 13.9 g/dL (ref 12.0–15.0)
MCH: 27.6 pg (ref 26.0–34.0)
MCHC: 32 g/dL (ref 30.0–36.0)
MCV: 86.3 fL (ref 80.0–100.0)
Platelets: 212 10*3/uL (ref 150–400)
RBC: 5.03 MIL/uL (ref 3.87–5.11)
RDW: 13.4 % (ref 11.5–15.5)
WBC: 4.8 10*3/uL (ref 4.0–10.5)
nRBC: 0 % (ref 0.0–0.2)

## 2021-10-09 LAB — TYPE AND SCREEN
ABO/RH(D): B POS
Antibody Screen: NEGATIVE

## 2021-10-09 LAB — RESP PANEL BY RT-PCR (FLU A&B, COVID) ARPGX2
Influenza A by PCR: POSITIVE — AB
Influenza B by PCR: NEGATIVE
SARS Coronavirus 2 by RT PCR: NEGATIVE

## 2021-10-09 MED ORDER — OXYCODONE-ACETAMINOPHEN 5-325 MG PO TABS: 2.0000 | ORAL_TABLET | ORAL | Status: DC | PRN

## 2021-10-09 MED ORDER — OXYTOCIN-SODIUM CHLORIDE 30-0.9 UT/500ML-% IV SOLN
2.5000 [IU]/h | INTRAVENOUS | Status: DC
  Filled 2021-10-09: qty 500

## 2021-10-09 MED ORDER — LACTATED RINGERS IV SOLN: INTRAVENOUS | Status: DC

## 2021-10-09 MED ORDER — OXYTOCIN BOLUS FROM INFUSION
333.0000 mL | Freq: Once | INTRAVENOUS | Status: AC
  Administered 2021-10-09: 23:00:00 333 mL via INTRAVENOUS

## 2021-10-09 MED ORDER — LIDOCAINE HCL (PF) 1 % IJ SOLN: 30.0000 mL | INTRAMUSCULAR | Status: DC | PRN

## 2021-10-09 MED ORDER — SODIUM CHLORIDE 0.9 % IV SOLN: 2.0000 g | Freq: Once | INTRAVENOUS | Status: DC

## 2021-10-09 MED ORDER — LACTATED RINGERS IV SOLN: 500.0000 mL | INTRAVENOUS | Status: DC | PRN

## 2021-10-09 MED ORDER — SOD CITRATE-CITRIC ACID 500-334 MG/5ML PO SOLN: 30.0000 mL | ORAL | Status: DC | PRN

## 2021-10-09 MED ORDER — ACETAMINOPHEN 325 MG PO TABS: 650.0000 mg | ORAL_TABLET | ORAL | Status: DC | PRN

## 2021-10-09 MED ORDER — ONDANSETRON HCL 4 MG/2ML IJ SOLN: 4.0000 mg | Freq: Four times a day (QID) | INTRAMUSCULAR | Status: DC | PRN

## 2021-10-09 MED ORDER — OXYTOCIN 10 UNIT/ML IJ SOLN: 10.0000 [IU] | Freq: Once | INTRAMUSCULAR | Status: DC

## 2021-10-09 MED ORDER — OXYCODONE-ACETAMINOPHEN 5-325 MG PO TABS: 1.0000 | ORAL_TABLET | ORAL | Status: DC | PRN

## 2021-10-09 NOTE — MAU Note (Signed)
Sara Barrett is a 35 y.o. at [redacted]w[redacted]d here in MAU reporting: contractions that began at 106. Pt denies SROM, vaginal bleeding or bloody show. Reports losing her mucous plug. Endorses+ fetal movement.  Pain score: 9 There were no vitals filed for this visit.   FHT:148bpm  Lab orders placed from triage:  mau labor

## 2021-10-09 NOTE — H&P (Signed)
Sara Barrett is a 35 y.o. female presenting @ 38 wk in active labor. Called by RN that pt was 9.5 cm bulging membrane. On arrival pt was  delivered before my arrival to L&D. See delivery note OB History     Gravida  2   Para  1   Term  1   Preterm      AB      Living  1      SAB      IAB      Ectopic      Multiple      Live Births  1          Past Medical History:  Diagnosis Date   No pertinent past medical history    Vaginal Pap smear, abnormal    Past Surgical History:  Procedure Laterality Date   denies surgical history     WISDOM TOOTH EXTRACTION     Family History: family history includes Cancer (age of onset: 10) in her mother; Diabetes in her brother and father; Heart disease in her mother. Social History:  reports that she has never smoked. She has never used smokeless tobacco. She reports current alcohol use. She reports that she does not use drugs.     Maternal Diabetes: No Genetic Screening: Declined Maternal Ultrasounds/Referrals: Isolated EIF (echogenic intracardiac focus) and Isolated choroid plexus cyst Fetal Ultrasounds or other Referrals:  Referred to Materal Fetal Medicine  Maternal Substance Abuse:  No Significant Maternal Medications:  Meds include: Protonix Significant Maternal Lab Results:  Group B Strep positive Other Comments:  None  Review of Systems  Respiratory:  Positive for cough.   All other systems reviewed and are negative. History Dilation: Lip/rim Effacement (%): 90 Station: 0 Exam by:: Sara Barrett pressure (!) 149/83, pulse 80, temperature 98 F (36.7 C), temperature source Oral, resp. rate 17, last menstrual period 01/18/2021. Exam Physical Exam Constitutional:      Appearance: Normal appearance.  Cardiovascular:     Rate and Rhythm: Regular rhythm.  Pulmonary:     Breath sounds: Normal breath sounds.  Abdominal:     Comments: Uterus at umb  Skin:    General: Skin is warm and dry.  Neurological:      Mental Status: She is alert and oriented to person, place, and time.  Psychiatric:        Mood and Affect: Mood normal.        Behavior: Behavior normal.    Prenatal labs: ABO, Rh: --/--/PENDING (11/27 2155) Antibody: PENDING (11/27 2155) Rubella: Immune (05/18 0000) RPR: Nonreactive, Nonreactive (09/20 0000)  HBsAg: Negative (05/18 0000)  HIV: Non-reactive (05/18 0000)  GBS: Positive/-- (11/07 0000)  HCV neg Assessment/Plan:  Postpartum care following delivery GBS cx positive P) admit routine pp care.    Sara Barrett A Sara Barrett 10/09/2021, 10:54 PM

## 2021-10-10 LAB — CBC
HCT: 37.8 % (ref 36.0–46.0)
Hemoglobin: 12.2 g/dL (ref 12.0–15.0)
MCH: 27.7 pg (ref 26.0–34.0)
MCHC: 32.3 g/dL (ref 30.0–36.0)
MCV: 85.7 fL (ref 80.0–100.0)
Platelets: 175 10*3/uL (ref 150–400)
RBC: 4.41 MIL/uL (ref 3.87–5.11)
RDW: 13.1 % (ref 11.5–15.5)
WBC: 7.6 10*3/uL (ref 4.0–10.5)
nRBC: 0 % (ref 0.0–0.2)

## 2021-10-10 LAB — RPR: RPR Ser Ql: NONREACTIVE

## 2021-10-10 MED ORDER — DIBUCAINE (PERIANAL) 1 % EX OINT: 1.0000 "application " | TOPICAL_OINTMENT | CUTANEOUS | Status: DC | PRN

## 2021-10-10 MED ORDER — WITCH HAZEL-GLYCERIN EX PADS: 1.0000 "application " | MEDICATED_PAD | CUTANEOUS | Status: DC | PRN

## 2021-10-10 MED ORDER — SIMETHICONE 80 MG PO CHEW: 80.0000 mg | CHEWABLE_TABLET | ORAL | Status: DC | PRN

## 2021-10-10 MED ORDER — ACETAMINOPHEN 325 MG PO TABS: 650.0000 mg | ORAL_TABLET | ORAL | Status: DC | PRN

## 2021-10-10 MED ORDER — DIPHENHYDRAMINE HCL 25 MG PO CAPS: 25.0000 mg | ORAL_CAPSULE | Freq: Four times a day (QID) | ORAL | Status: DC | PRN

## 2021-10-10 MED ORDER — MEDROXYPROGESTERONE ACETATE 150 MG/ML IM SUSP: 150.0000 mg | INTRAMUSCULAR | Status: DC | PRN

## 2021-10-10 MED ORDER — SENNOSIDES-DOCUSATE SODIUM 8.6-50 MG PO TABS
2.0000 | ORAL_TABLET | Freq: Every day | ORAL | Status: DC
  Administered 2021-10-11: 2 via ORAL
  Filled 2021-10-10 (×2): qty 2

## 2021-10-10 MED ORDER — OSELTAMIVIR PHOSPHATE 75 MG PO CAPS
75.0000 mg | ORAL_CAPSULE | Freq: Two times a day (BID) | ORAL | Status: DC
  Administered 2021-10-10 – 2021-10-11 (×4): 75 mg via ORAL
  Filled 2021-10-10 (×5): qty 1

## 2021-10-10 MED ORDER — BENZOCAINE-MENTHOL 20-0.5 % EX AERO: 1.0000 "application " | INHALATION_SPRAY | CUTANEOUS | Status: DC | PRN

## 2021-10-10 MED ORDER — TETANUS-DIPHTH-ACELL PERTUSSIS 5-2.5-18.5 LF-MCG/0.5 IM SUSY: 0.5000 mL | PREFILLED_SYRINGE | Freq: Once | INTRAMUSCULAR | Status: DC

## 2021-10-10 MED ORDER — MEASLES, MUMPS & RUBELLA VAC IJ SOLR: 0.5000 mL | Freq: Once | INTRAMUSCULAR | Status: DC

## 2021-10-10 MED ORDER — FERROUS SULFATE 325 (65 FE) MG PO TABS
325.0000 mg | ORAL_TABLET | Freq: Two times a day (BID) | ORAL | Status: DC
  Administered 2021-10-10 – 2021-10-11 (×4): 325 mg via ORAL
  Filled 2021-10-10 (×4): qty 1

## 2021-10-10 MED ORDER — ONDANSETRON HCL 4 MG/2ML IJ SOLN: 4.0000 mg | INTRAMUSCULAR | Status: DC | PRN

## 2021-10-10 MED ORDER — IBUPROFEN 600 MG PO TABS
600.0000 mg | ORAL_TABLET | Freq: Four times a day (QID) | ORAL | Status: DC
  Administered 2021-10-10 – 2021-10-11 (×7): 600 mg via ORAL
  Filled 2021-10-10 (×6): qty 1

## 2021-10-10 MED ORDER — ONDANSETRON HCL 4 MG PO TABS: 4.0000 mg | ORAL_TABLET | ORAL | Status: DC | PRN

## 2021-10-10 MED ORDER — COCONUT OIL OIL: 1.0000 "application " | TOPICAL_OIL | Status: DC | PRN

## 2021-10-10 MED ORDER — PRENATAL MULTIVITAMIN CH
1.0000 | ORAL_TABLET | Freq: Every day | ORAL | Status: DC
  Administered 2021-10-10 – 2021-10-11 (×2): 1 via ORAL
  Filled 2021-10-10 (×2): qty 1

## 2021-10-10 MED ORDER — ZOLPIDEM TARTRATE 5 MG PO TABS: 5.0000 mg | ORAL_TABLET | Freq: Every evening | ORAL | Status: DC | PRN

## 2021-10-10 NOTE — Lactation Note (Signed)
This note was copied from a baby's chart. Lactation Consultation Note  Patient Name: Sara Barrett [id number] Date: 10/10/2021 Reason for consult: Initial assessment;Early term 37-38.6wks Age:34 hours   P2 mother whose infant is now 50 hours old.  This is an ETI at 38+0 weeks.  Mother breast fed her first child (now 54 years old) for 1-1/2 years.  Mother's current feeding preference is breast.  Lactation order entered at 1518 today.  Baby showing cues.  Assisted to latch in the football hold to the left breast easily.  Baby with a wide gape and flanged lips.  Reviewed breast feeding basics with mother.  Worked with positioning since mother feels a little bit uncomfortable with holding.  Stressed the importance of good breast support due to her large breasts.  Provided pillows for comfort.  Mother denied pain with latching.  Observed baby "Phoebe Perch" feeding for 15 minutes before leaving the room.  Encouraged to feed 8-12 times/24 hours or sooner if "Phoebe Perch" shows cues.  Mother will call her RN/LC for assistance as needed.    Mother has a DEBP for home use.  No support person present at this time.   Maternal Data Has patient been taught Hand Expression?: Yes Does the patient have breastfeeding experience prior to this delivery?: Yes How long did the patient breastfeed?: 1-1/2 years with her first child  Feeding Mother's Current Feeding Choice: Breast Milk  LATCH Score Latch: Repeated attempts needed to sustain latch, nipple held in mouth throughout feeding, stimulation needed to elicit sucking reflex.  Audible Swallowing: None  Type of Nipple: Everted at rest and after stimulation  Comfort (Breast/Nipple): Soft / non-tender  Hold (Positioning): Assistance needed to correctly position infant at breast and maintain latch.  LATCH Score: 6   Lactation Tools Discussed/Used    Interventions Interventions: Breast feeding basics reviewed;Assisted with latch;Skin to  skin;Breast massage;Hand express;Breast compression;Position options;Support pillows;Adjust position;Education;LC Services brochure  Discharge Pump: Personal WIC Program: No  Consult Status Consult Status: Follow-up Date: 10/11/21 Follow-up type: In-patient    Delaina Fetsch R Nelline Lio 10/10/2021, 4:10 PM

## 2021-10-10 NOTE — Progress Notes (Signed)
PPD1 SVD:   S:  Pt reports feeling better/ Tolerating po/ Voiding without problems/ No n/v/ Bleeding is light/ Pain controlled withprescription NSAID's including ibuprofen (Motrin)  Newborn info live female BRF   O:  A & O x 3 / VS: Blood pressure 123/70, pulse 76, temperature 98.5 F (36.9 C), temperature source Oral, resp. rate 17, last menstrual period 01/18/2021, SpO2 99 %.  LABS:  Results for orders placed or performed during the hospital encounter of 10/09/21 (from the past 24 hour(s))  Resp Panel by RT-PCR (Flu A&B, Covid) Nasopharyngeal Swab     Status: Abnormal   Collection Time: 10/09/21  9:44 PM   Specimen: Nasopharyngeal Swab; Nasopharyngeal(NP) swabs in vial transport medium  Result Value Ref Range   SARS Coronavirus 2 by RT PCR NEGATIVE NEGATIVE   Influenza A by PCR POSITIVE (A) NEGATIVE   Influenza B by PCR NEGATIVE NEGATIVE  CBC     Status: None   Collection Time: 10/09/21  9:55 PM  Result Value Ref Range   WBC 4.8 4.0 - 10.5 K/uL   RBC 5.03 3.87 - 5.11 MIL/uL   Hemoglobin 13.9 12.0 - 15.0 g/dL   HCT 16.1 09.6 - 04.5 %   MCV 86.3 80.0 - 100.0 fL   MCH 27.6 26.0 - 34.0 pg   MCHC 32.0 30.0 - 36.0 g/dL   RDW 40.9 81.1 - 91.4 %   Platelets 212 150 - 400 K/uL   nRBC 0.0 0.0 - 0.2 %  Type and screen [facility]     Status: None   Collection Time: 10/09/21  9:55 PM  Result Value Ref Range   ABO/RH(D) B POS    Antibody Screen NEG    Sample Expiration      10/12/2021,2359 Performed at [facility], [address] [place], [place] [address]   RPR     Status: None   Collection Time: 10/09/21  9:55 PM  Result Value Ref Range   RPR Ser Ql NON REACTIVE NON REACTIVE  CBC     Status: None   Collection Time: 10/10/21  4:53 AM  Result Value Ref Range   WBC 7.6 4.0 - 10.5 K/uL   RBC 4.41 3.87 - 5.11 MIL/uL   Hemoglobin 12.2 12.0 - 15.0 g/dL   HCT 62.1 30.8 - 65.7 %   MCV 85.7 80.0 - 100.0 fL   MCH 27.7 26.0 - 34.0 pg   MCHC 32.3 30.0 - 36.0  g/dL   RDW 84.6 96.2 - 95.2 %   Platelets 175 150 - 400 K/uL   nRBC 0.0 0.0 - 0.2 %    I&O: I/O last 3 completed shifts: In: -  Out: 50 [Blood:50]   No intake/output data recorded.  Lungs: chest clear, no wheezing, rales, normal symmetric air entry  Heart: regular rate and rhythm, S1, S2 normal, no murmur, click, rub or gallop  Abdomen: uterus firm@ umb  Perineum: is normal  Lochia: light  Extremities:no redness or tenderness in the calves or thighs, edema tr+    A/P: PPD # 1/ G2P2 Influenza on Tamiflu GBS cx (+) inadequate treatment due to rapid delivery  Doing well  Continue routine post partum orders  Cont tamiflu

## 2021-10-11 ENCOUNTER — Other Ambulatory Visit (HOSPITAL_COMMUNITY): Payer: Self-pay

## 2021-10-11 MED ORDER — OSELTAMIVIR PHOSPHATE 75 MG PO CAPS
75.0000 mg | ORAL_CAPSULE | Freq: Two times a day (BID) | ORAL | 0 refills | Status: AC
Start: 2021-10-11 — End: 2021-10-14

## 2021-10-11 MED ORDER — OSELTAMIVIR PHOSPHATE 75 MG PO CAPS
75.0000 mg | ORAL_CAPSULE | Freq: Two times a day (BID) | ORAL | 0 refills | Status: AC
  Filled 2021-10-11: qty 10, 5d supply, fill #0

## 2021-10-11 MED ORDER — IBUPROFEN 600 MG PO TABS: 600.0000 mg | ORAL_TABLET | Freq: Four times a day (QID) | ORAL | 11 refills | Status: DC | PRN

## 2021-10-11 NOTE — Lactation Note (Signed)
This note was copied from a baby's chart. Lactation Consultation Note  Patient Name: Sara Barrett [id number] Date: 10/11/2021 Reason for consult: Follow-up assessment Age:34 hours  Baby came off breast as LC entered room. Mother denies questions or concerns. Feed on demand with cues.  Goal 8-12+ times per day after first 24 hrs.  Place baby STS if not cueing.  Reviewed engorgement care and monitoring voids/stools.   LATCH Score Latch: Grasps breast easily, tongue down, lips flanged, rhythmical sucking.  Audible Swallowing: A few with stimulation  Type of Nipple: Everted at rest and after stimulation  Comfort (Breast/Nipple): Soft / non-tender  Hold (Positioning): No assistance needed to correctly position infant at breast.  LATCH Score: 9   Interventions Interventions: Education  Discharge Discharge Education: Engorgement and breast care;Warning signs for feeding baby  Consult Status Consult Status: Complete Date: 10/11/21    Dahlia Byes [facility] 10/11/2021, 1:19 PM

## 2021-10-11 NOTE — Progress Notes (Signed)
PPD2 SVD:   S:  Pt reports feeling well. Recovering from flu/ Tolerating po/ Voiding without problems/ No n/v/ Bleeding is light/ Pain controlled withprescription NSAID's including motrin  Newborn info live female BRF   O:  A & O x 3 / VS: Blood pressure 123/68, pulse 64, temperature 97.8 F (36.6 C), temperature source Oral, resp. rate 18, last menstrual period 01/18/2021, SpO2 100 %.  LABS: No results found for this or any previous visit (from the past 24 hour(s)).  I&O   No intake/output data recorded.  Lungs: chest clear, no wheezing, rales, normal symmetric air entry  Heart: regular rate and rhythm, S1, S2 normal, no murmur, click, rub or gallop  Abdomen: uterus firm at umb non tender  Perineum: is normal  Lochia: light  Extremities:no redness or tenderness in the calves or thighs, edema tr+    A/P: PPD # 2/ G2P2 S/p SVD Influenza on Tamiflu  Doing well  Continue routine post partum orders  Complete tamiflu treatment D/c instructions reviewed.   F/u 6 wk Tamiflu not available at her pharm Outpatient pharmacy filled due to the auspices of the pharmacist

## 2021-10-11 NOTE — Discharge Summary (Signed)
Postpartum Discharge Summary  Date of Service updated     Patient Name: Sara Barrett DOB: Dec 28, 1985 MRN: [id number]  Date of admission: 10/09/2021 Delivery date:10/09/2021  Delivering provider: Patriciaann Clan  Date of discharge: 10/11/2021  Admitting diagnosis: Indication for care in labor or delivery [O75.9] Postpartum care following vaginal delivery [Z39.2] Intrauterine pregnancy: [redacted]w[redacted]d    Secondary diagnosis:  Principal Problem:   Indication for care in labor or delivery Active Problems:   Postpartum care following vaginal delivery GBS cx positive Additional problems: Influenza    Discharge diagnosis: Term Pregnancy Delivered and Influenza                                               Post partum procedures: none Augmentation: N/A Complications: None  Hospital course: Onset of Labor With Vaginal Delivery      35y.o. yo G2P1001 at 342w3das admitted in Active Labor on 10/09/2021. Due to rapid labor, pt did not receive Group B strep prophylaxis treatment.  Patient had an uncomplicated labor course as follows:  Membrane Rupture Time/Date: 10:35 PM ,10/09/2021   Delivery Method:Vaginal, Spontaneous  Episiotomy: None  Lacerations:  None  Patient had an uncomplicated postpartum course.   Treated with tamiflu. She is ambulating, tolerating a regular diet, passing flatus, and urinating well. Patient is discharged home in stable condition on 11/29/'2022.  Newborn Data: Birth date:10/09/2021  Birth time:10:37 PM  Gender:Female  Living status:Living  Apgars:8 ,9  Weight:2.88 kg   Magnesium Sulfate received: No BMZ received: No Rhophylac:No MMR:No T-DaP:Given prenatally Flu: No Transfusion:No  Physical exam  Vitals:   10/10/21 0500 10/10/21 0945 10/10/21 2123 10/11/21 0528  BP: 125/74 123/70 118/64 123/68  Pulse: 60 76 77 64  Resp:  '17 18 18  ' Temp:  98.5 F (36.9 C) 98.5 F (36.9 C) 97.8 F (36.6 C)  TempSrc: Oral Oral Oral Oral  SpO2: 99% 99% 100%  100%   General: alert, cooperative, and no distress Lochia: appropriate Uterine Fundus: firm Incision: N/A DVT Evaluation: No evidence of DVT seen on physical exam. Labs: Lab Results  Component Value Date   WBC 7.6 10/10/2021   HGB 12.2 10/10/2021   HCT 37.8 10/10/2021   MCV 85.7 10/10/2021   PLT 175 10/10/2021   CMP Latest Ref Rng & Units 04/07/2017  Glucose 65 - 99 mg/dL 80  BUN 6 - 20 mg/dL 8  Creatinine 0.57 - 1.00 mg/dL 0.79  Sodium 134 - 144 mmol/L 138  Potassium 3.5 - 5.2 mmol/L 4.7  Chloride 96 - 106 mmol/L 103  CO2 18 - 29 mmol/L 23  Calcium 8.7 - 10.2 mg/dL 9.1  Total Protein 6.0 - 8.5 g/dL 6.7  Total Bilirubin 0.0 - 1.2 mg/dL 0.4  Alkaline Phos 39 - 117 IU/L 70  AST 0 - 40 IU/L 21  ALT 0 - 32 IU/L 19   Edinburgh Score: Edinburgh Postnatal Depression Scale Screening Tool 10/10/2021  I have been able to laugh and see the funny side of things. 0  I have looked forward with enjoyment to things. 0  I have blamed myself unnecessarily when things went wrong. 0  I have been anxious or worried for no good reason. 0  I have felt scared or panicky for no good reason. 0  Things have been getting on top of me. 0  I have  been so unhappy that I have had difficulty sleeping. 0  I have felt sad or miserable. 0  I have been so unhappy that I have been crying. 0  The thought of harming myself has occurred to me. 0  Edinburgh Postnatal Depression Scale Total 0      After visit meds:  Allergies as of 10/11/2021   No Known Allergies      Medication List     TAKE these medications    ibuprofen 600 MG tablet Commonly known as: ADVIL Take 1 tablet (600 mg total) by mouth every 6 (six) hours as needed.   oseltamivir 75 MG capsule Commonly known as: TAMIFLU Take 1 capsule (75 mg total) by mouth 2 (two) times daily for 3 days.   prenatal multivitamin Tabs tablet Take 1 tablet by mouth daily at 12 noon.         Discharge home in stable condition Infant  Feeding: Breast Infant Disposition:home with mother Discharge instruction: per After Visit Summary and Postpartum booklet. Activity: Advance as tolerated. Pelvic rest for 6 weeks.  Diet: routine diet Anticipated Birth Control:  plans vasectomy Postpartum Appointment:6 weeks Additional Postpartum F/U:  n/a Future Appointments:No future appointments. Follow up Visit:  Follow-up Information     Servando Salina, MD Follow up in 6 week(s).   Specialty: Obstetrics and Gynecology Contact information: [address] [place] [address] [phone]                     10/11/2021 Marvene Staff, MD

## 2021-10-22 ENCOUNTER — Telehealth (HOSPITAL_COMMUNITY): Payer: Self-pay

## 2021-10-22 NOTE — Telephone Encounter (Signed)
No answer. Left message to return nurse call.  Marcelino Duster [facility] 10/22/2021,1001

## 2022-06-25 IMAGING — US US OB COMP LESS 14 WK
1 series · 15 of 28 positions shown · non-contrast
Comparison: None

CLINICAL DATA: First trimester pregnancy of uncertain fetal
viability; LMP 01/18/2021; quantitative beta HCG = 4,020 on
02/17/2021

EXAM:
OBSTETRIC <14 WK ULTRASOUND
TECHNIQUE: Transabdominal ultrasound was performed for evaluation of the
gestation as well as the maternal uterus and adnexal regions.

[Series 1: us ob comp less 14 wk · 15 of 77 slices shown]
[im 1/77]
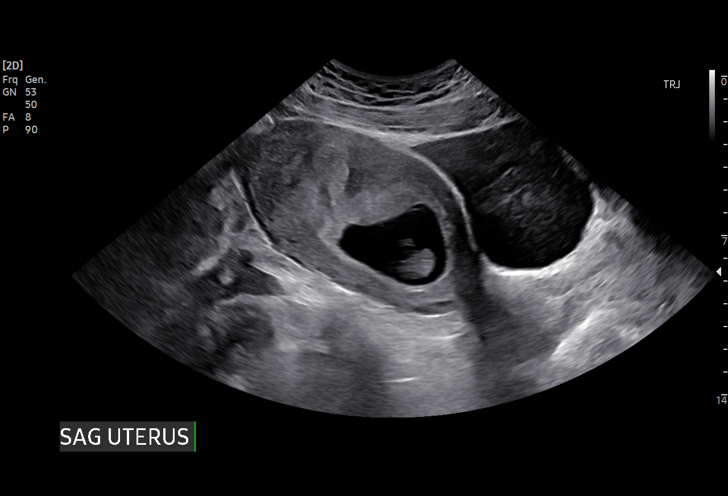
[im 6/77]
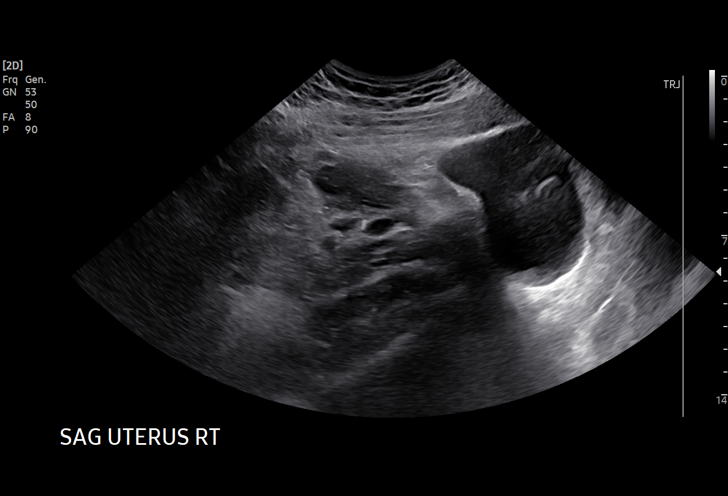
[im 12/77]
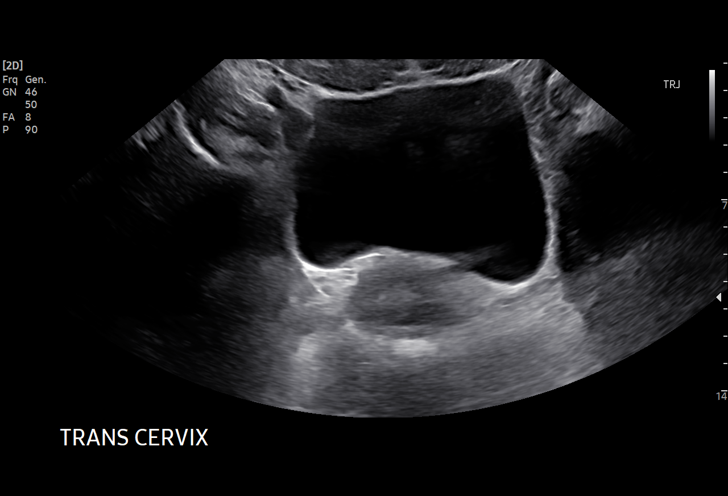
[im 17/77]
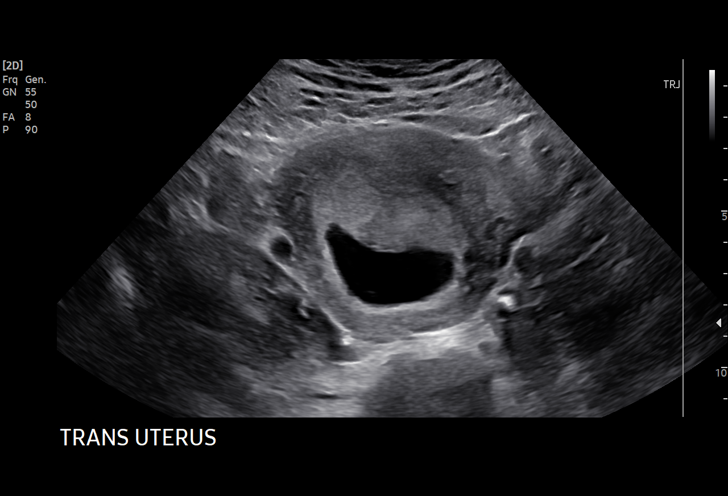
[im 23/77]
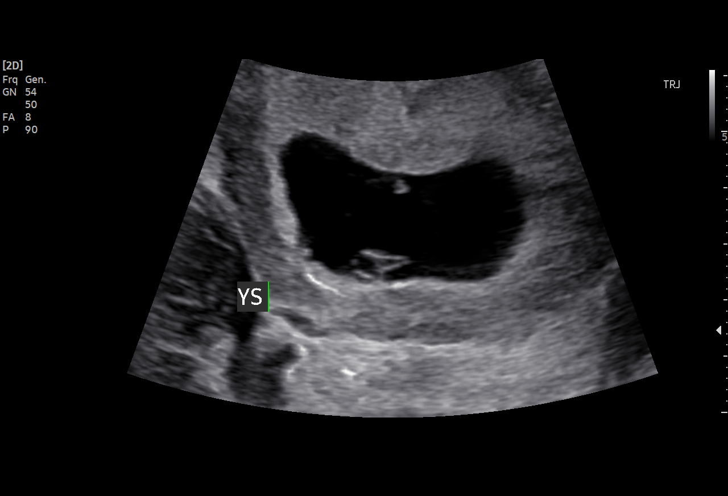
[im 29/77]
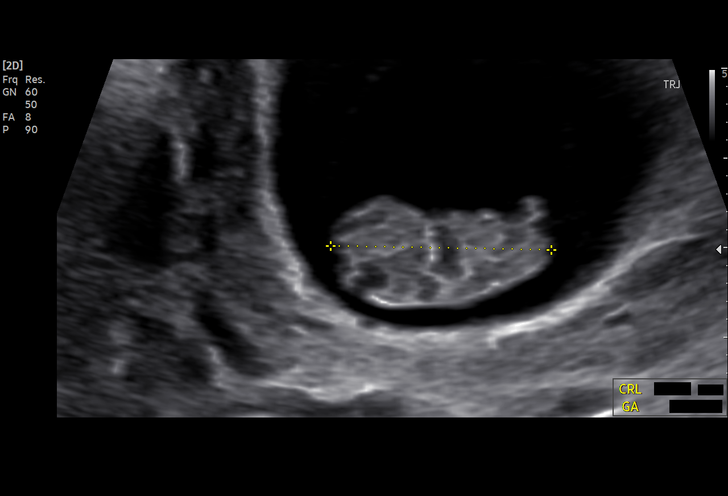
[im 34/77]
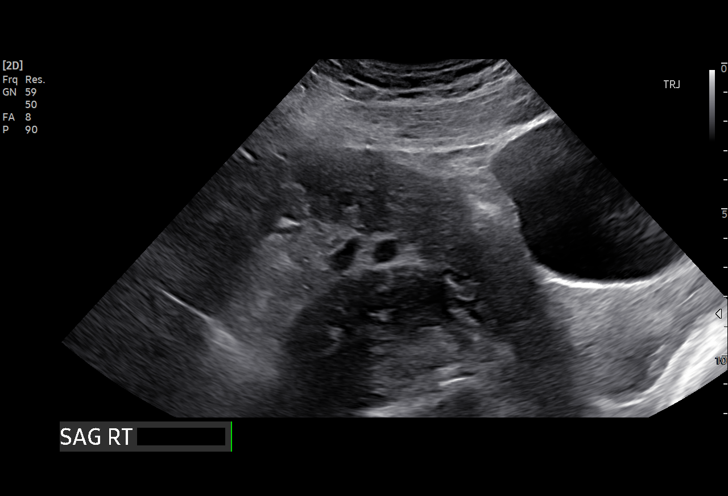
[im 40/77]
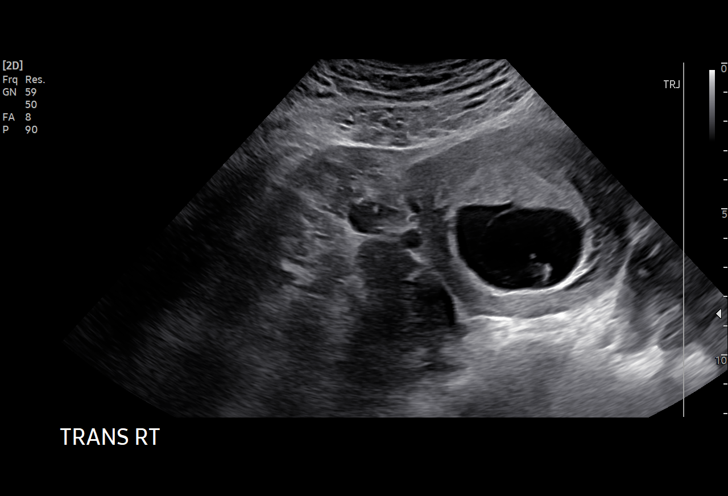
[im 43/77]
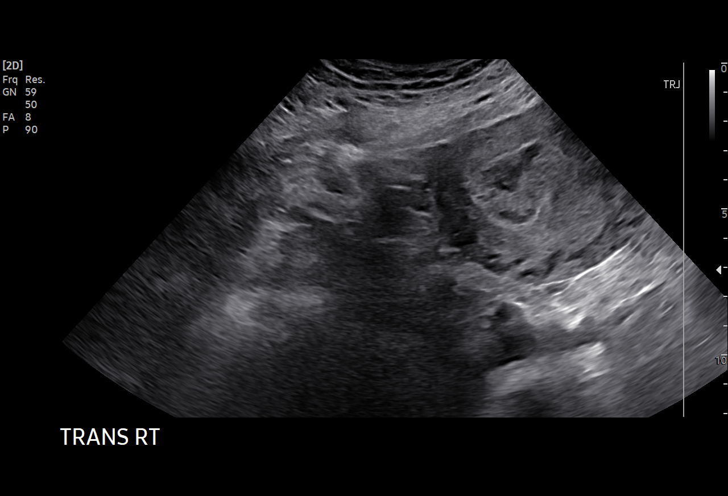
[im 48/77]
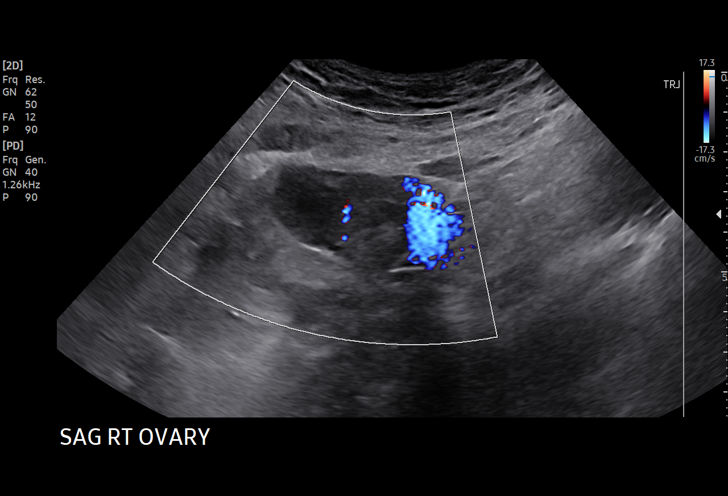
[im 54/77]
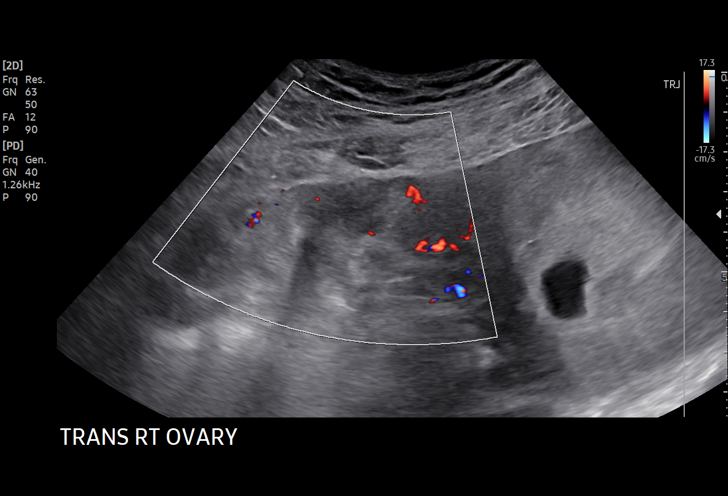
[im 60/77]
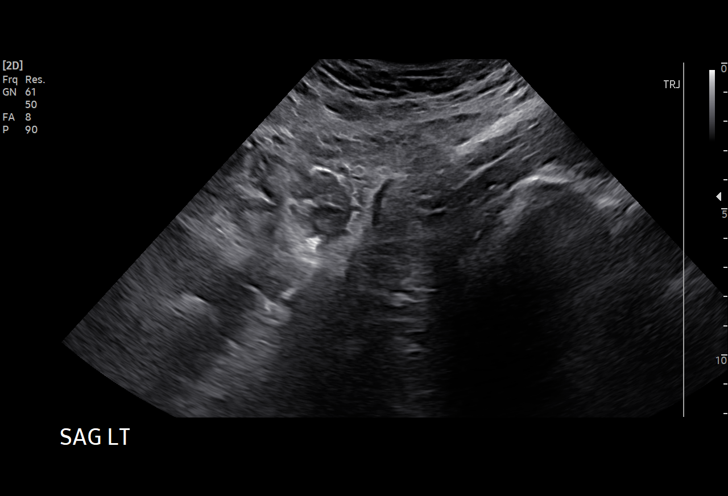
[im 65/77]
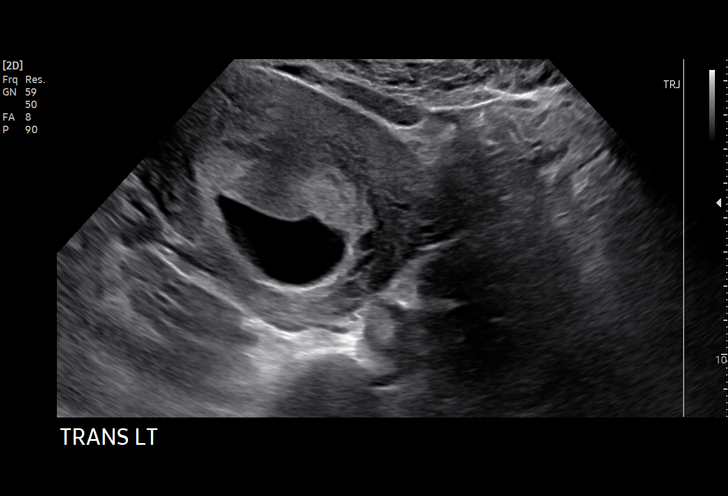
[im 71/77]
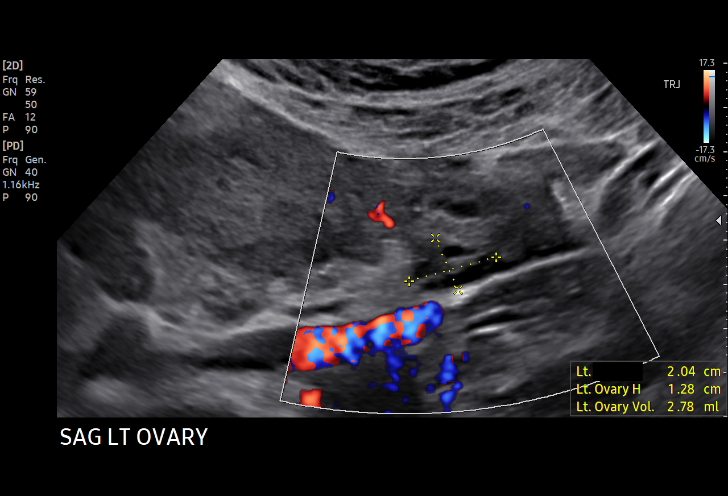
[im 77/77]
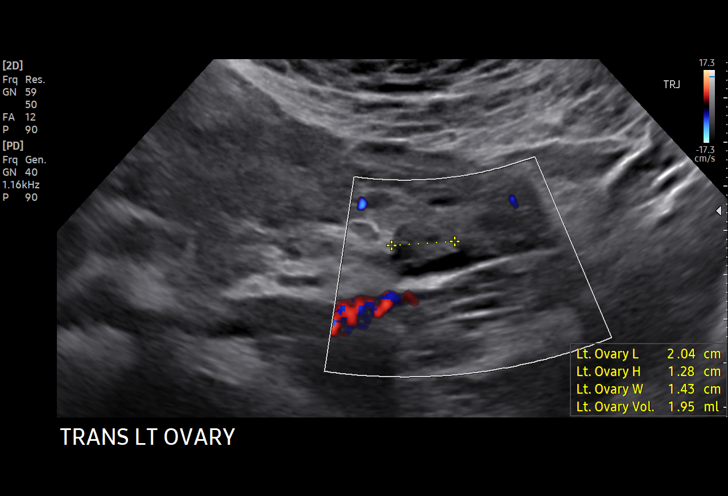

[15 of 28 positions shown; findings below may reference images not displayed]

FINDINGS: Intrauterine gestational sac: Present, single

Yolk sac:  Present

Embryo:  Present

Cardiac Activity: Present

Heart Rate: 164 bpm

CRL:   24.2 mm   9 w 1 d                  US EDC: 10/23/2021

Subchorionic hemorrhage:  Small subchronic hemorrhage

Maternal uterus/adnexae:

Low implantation of gestational sac at the lower uterine segment.

Ovaries unremarkable.

No free pelvic fluid or adnexal masses.
IMPRESSION: Single live intrauterine gestation at 9 weeks 1 day EGA by
crown-rump length.

Low implantation of gestational sac at lower uterine segment.

Remainder of exam unremarkable.

## 2022-09-12 ENCOUNTER — Telehealth (INDEPENDENT_AMBULATORY_CARE_PROVIDER_SITE_OTHER): Payer: Self-pay

## 2022-09-12 DIAGNOSIS — O09522 Supervision of elderly multigravida, second trimester: Secondary | ICD-10-CM | POA: Insufficient documentation

## 2022-09-12 DIAGNOSIS — Z3689 Encounter for other specified antenatal screening: Secondary | ICD-10-CM

## 2022-09-12 DIAGNOSIS — O09892 Supervision of other high risk pregnancies, second trimester: Secondary | ICD-10-CM | POA: Insufficient documentation

## 2022-09-12 DIAGNOSIS — O0990 Supervision of high risk pregnancy, unspecified, unspecified trimester: Secondary | ICD-10-CM | POA: Insufficient documentation

## 2022-09-12 LAB — CHG URINE PREGNANCY TEST VISUAL COLOR CMPRSN METHS: Pregnancy Test, Urine: POSITIVE

## 2022-09-12 MED ORDER — BLOOD PRESSURE MONITORING DEVI: 1.0000 | 0 refills | Status: DC

## 2022-09-12 MED ORDER — GOJJI WEIGHT SCALE MISC: 1.0000 | 0 refills | Status: DC

## 2022-09-12 NOTE — Progress Notes (Signed)
New OB Intake  I connected withNAME@ on 09/12/22 at 10:15 AM EDT by MyChart Video Visit and verified that I am speaking with the correct person using two identifiers. Nurse is located at [facility] and pt is located at [facility].  I discussed the limitations, risks, security and privacy concerns of performing an evaluation and management service by telephone and the availability of in person appointments. I also discussed with the patient that there may be a patient responsible charge related to this service. The patient expressed understanding and agreed to proceed.  I explained I am completing New OB Intake today. We discussed EDD of 02/15/23 that is based on LMP of 05/11/22. Pt is G3/P2. I reviewed her allergies, medications, Medical/Surgical/OB history, and appropriate screenings. I informed her of [facility] services. [facility] information placed in AVS. Based on history, this is a low risk pregnancy.  Patient Active Problem List   Diagnosis Date Noted   Postpartum care following vaginal delivery 10/10/2021   Indication for care in labor or delivery 10/09/2021    Concerns addressed today  Delivery Plans Plans to deliver at [facility]. Patient given information for [facility] Healthy Baby website for more information about Women's and [place]. Patient is not interested in water birth. Offered upcoming OB visit with CNM to discuss further.  MyChart/Babyscripts MyChart access verified. I explained pt will have some visits in office and some virtually. Babyscripts instructions given and order placed. Patient verifies receipt of registration text/e-mail. Account successfully created and app downloaded.  Blood Pressure Cuff/Weight Scale Blood pressure cuff ordered for patient to pick-up from [employer]. Explained after first prenatal appt pt will check weekly and document in 52. Patient does not have weight scale; order sent to [place], patient may track weight weekly in  Babyscripts.  Anatomy US Explained first scheduled [place] will be around 19 weeks. Anatomy US scheduled for 09/25/22 at 1245. Pt notified to arrive at 1230.  Labs Discussed Johnsie Cancel genetic screening with patient. Would like both Panorama and Horizon drawn at new OB visit. Routine prenatal labs needed.  COVID Vaccine Patient has had COVID vaccine.   Is patient a CenteringPregnancy candidate?  Accepted Declined due to  NA Not a candidate due to  NA If accepted,    Is patient a Mom+Baby Combined Care candidate?  Not a candidate   If accepted, Mom+Baby staff notified  Social Determinants of Health Food Insecurity: Patient denies food insecurity. WIC Referral: Patient is interested in referral to [facility].  Transportation: Patient denies transportation needs. Childcare: Discussed no children allowed at ultrasound appointments. Offered childcare services; patient declines childcare services at this time.  First visit review I reviewed new OB appt with patient. I explained they will have a provider visit that includes . Explained pt will be seen by Dr. Dione Plover at first visit; encounter routed to appropriate provider. Explained that patient will be seen by pregnancy navigator following visit with provider.   Bethanne Ginger, CMA 09/12/2022  10:33 AM

## 2022-09-14 ENCOUNTER — Encounter: Payer: Self-pay | Admitting: *Deleted

## 2022-09-21 NOTE — Progress Notes (Signed)
INITIAL PRENATAL VISIT  Subjective:   Sara Barrett is 36 y.o. [phone] female being seen today for her first obstetrical visit. She  has  received other prenatal care previously this pregnancy at [facility] .  This is a desired pregnancy.  She is at [redacted]w[redacted]d gestation by 10 week [place]. Period was irreg. Her obstetrical history is significant for  close pregnancy spacing .  Patient does intend to breast feed. Pregnancy history fully reviewed.  Review of Systems:   ROS no complaints.  Objective:    Obstetric History OB History  Gravida Para Term Preterm AB Living  3 2 2     2   SAB IAB Ectopic Multiple Live Births          2    # Outcome Date GA Lbr Len/2nd Weight Sex Delivery Anes PTL Lv  3 Current           2 Term 10/09/21 [redacted]w[redacted]d   F Vag-Spont  N LIV  1 Term 09/06/15 [redacted]w[redacted]d  6 lb (2.722 kg) M Vag-Spont        Birth Comments: Born in [redacted]w[redacted]d    Past Medical History:  Diagnosis Date   No pertinent past medical history    Vaginal Pap smear, abnormal     Past Surgical History:  Procedure Laterality Date   denies surgical history     WISDOM TOOTH EXTRACTION      Current Outpatient Medications on File Prior to Visit  Medication Sig Dispense Refill   cholecalciferol (VITAMIN D3) 25 MCG (1000 UNIT) tablet Take 1,000 Units by mouth daily.     Prenatal MV & Min w/FA-DHA (PRENATAL GUMMIES) 0.18-25 MG CHEW Chew by mouth.     Blood Pressure Monitoring DEVI 1 each by Does not apply route once a week. 1 each 0   Misc. Devices (GOJJI WEIGHT SCALE) MISC 1 each by Does not apply route once a week. 1 each 0   No current facility-administered medications on file prior to visit.    No Known Allergies  Social History:  reports that she has never smoked. She has never used smokeless tobacco. She reports current alcohol use. She reports that she does not use drugs.  Family History  Problem Relation Age of Onset   Cancer Mother 48   Heart disease Mother    Diabetes Father    Heart attack  Father    Diabetes Brother     The following portions of the patient's history were reviewed and updated as appropriate: allergies, current medications, past family history, past medical history, past social history, past surgical history and problem list.  Physical Exam:  BP 107/64   Pulse 69   Wt 247 lb (112 kg)   LMP 05/11/2022   BMI 39.27 kg/m  CONSTITUTIONAL: Well-developed, well-nourished female in no acute distress.  HENT:  Normocephalic, atraumatic. Oropharynx is clear and moist EYES: Conjunctivae normal. No scleral icterus.  SKIN: Skin is warm and dry. No rash noted. Not diaphoretic. No erythema. No pallor. MUSCULOSKELETAL: Normal range of motion. No tenderness.  No cyanosis, clubbing, or edema.   NEUROLOGIC: Alert and oriented to person, place, and time. Normal muscle tone coordination.  PSYCHIATRIC: Normal mood and affect. Normal behavior. Normal judgment and thought content. CARDIOVASCULAR: Normal heart rate noted. RESPIRATORY: Effort and rate normal. BREASTS: Declined ABDOMEN: Soft, no distention, tenderness, rebound or guarding. Fundal ht: @ U PELVIC: Deferred. Fetal Status: Fetal Heart Rate (bpm): 145   Movement: Present     Indications for ASA  therapy (per uptodate) Two or more of the following: Obesity (body mass index >30 kg/m2) Yes Family history of preeclampsia in mother or sister Yes Age ?35 years Yes Sociodemographic characteristics (African American race, low socioeconomic level) Yes Personal risk factors (eg, previous pregnancy with low birth weight or small for gestational age infant, previous adverse pregnancy outcome [eg, stillbirth], interval >10 years between pregnancies) No  Indications for early GDM screening   BMI >30kg/m2 Yes Age > 35 Yes  Early screening tests: FBS, A1C, Random CBG, glucose challenge   Assessment:     1. Supervision of high risk pregnancy, antepartum - Centering Group 9 - Culture, OB Urine -  CBC/D/Plt+RPR+Rh+ABO+RubIgG... - Hemoglobin A1c - HORIZON Custom - Panorama Prenatal Test Full Panel - AFP, Serum, Open Spina Bifida - Prenat-FeFum-FePo-FA-Omega 3 (CONCEPT DHA) 53.5-38-1 MG CAPS; Take 1 tablet by mouth daily.  Dispense: 30 capsule; Refill: 12 - Requested Pap records  2. AMA (advanced maternal age) multigravida 35+, second trimester - Growth US's per MFM - Culture, OB Urine - CBC/D/Plt+RPR+Rh+ABO+RubIgG... - Hemoglobin A1c  3. [redacted] weeks gestation of pregnancy  - Culture, OB Urine - CBC/D/Plt+RPR+Rh+ABO+RubIgG... - Hemoglobin A1c   5. Short interval between pregnancies affecting pregnancy in second trimester, antepartum  - Flu Vaccine QUAD 36+ mos IM (Fluarix, Quad PF)  6. Need for influenza vaccination  - Flu Vaccine QUAD 36+ mos IM (Fluarix, Quad PF)    Plan:  Initial labs drawn. Prenatal vitamins. Rx ASA for reduction of risk for preeclampsia.  Problem list reviewed and updated. Genetic screening discussed: NIPS/First trimester screen/Quad/AFP ordered. Role of ultrasound in pregnancy discussed; Anatomy US: ordered. Amniocentesis discussed: not indicated. Follow up in 1 weeks. (Centering) Discussed clinic routines, schedule of care and testing, genetic screening options, involvement of students and residents under the direct supervision of APPs and doctors and presence of female providers. Pt verbalized understanding.  [place], CNM 09/22/2022 10:42 AM

## 2022-09-22 ENCOUNTER — Encounter: Payer: Self-pay | Admitting: Advanced Practice Midwife

## 2022-09-22 ENCOUNTER — Ambulatory Visit (INDEPENDENT_AMBULATORY_CARE_PROVIDER_SITE_OTHER): Payer: Medicaid Other | Admitting: Advanced Practice Midwife

## 2022-09-22 ENCOUNTER — Other Ambulatory Visit: Payer: Self-pay

## 2022-09-22 VITALS — BP 107/64 | HR 69 | Wt 247.0 lb

## 2022-09-22 DIAGNOSIS — O0992 Supervision of high risk pregnancy, unspecified, second trimester: Secondary | ICD-10-CM

## 2022-09-22 DIAGNOSIS — O09522 Supervision of elderly multigravida, second trimester: Secondary | ICD-10-CM | POA: Diagnosis not present

## 2022-09-22 DIAGNOSIS — O09899 Supervision of other high risk pregnancies, unspecified trimester: Secondary | ICD-10-CM | POA: Insufficient documentation

## 2022-09-22 DIAGNOSIS — O09892 Supervision of other high risk pregnancies, second trimester: Secondary | ICD-10-CM | POA: Diagnosis not present

## 2022-09-22 DIAGNOSIS — Z23 Encounter for immunization: Secondary | ICD-10-CM | POA: Diagnosis not present

## 2022-09-22 DIAGNOSIS — Z3A19 19 weeks gestation of pregnancy: Secondary | ICD-10-CM

## 2022-09-22 DIAGNOSIS — O0990 Supervision of high risk pregnancy, unspecified, unspecified trimester: Secondary | ICD-10-CM

## 2022-09-22 LAB — POCT URINALYSIS DIP (DEVICE)
Bilirubin Urine: NEGATIVE
Glucose, UA: NEGATIVE mg/dL
Hgb urine dipstick: NEGATIVE
Ketones, ur: NEGATIVE mg/dL
Leukocytes,Ua: NEGATIVE
Nitrite: NEGATIVE
Protein, ur: NEGATIVE mg/dL
Specific Gravity, Urine: 1.015 (ref 1.005–1.030)
Urobilinogen, UA: 0.2 mg/dL (ref 0.0–1.0)
pH: 7 (ref 5.0–8.0)

## 2022-09-22 MED ORDER — CONCEPT DHA 53.5-38-1 MG PO CAPS: 1.0000 | ORAL_CAPSULE | Freq: Every day | ORAL | 12 refills | Status: DC

## 2022-09-22 NOTE — Patient Instructions (Signed)
[email or web address]  

## 2022-09-24 LAB — AFP, SERUM, OPEN SPINA BIFIDA
AFP MoM: 1.2
AFP Value: 50 ng/mL
Gest. Age on Collection Date: 19.1 weeks
Maternal Age At EDD: 35.7 yr
OSBR Risk 1 IN: 10000
Test Results:: NEGATIVE
Weight: 247 [lb_av]

## 2022-09-24 LAB — HCV INTERPRETATION

## 2022-09-24 LAB — CBC/D/PLT+RPR+RH+ABO+RUBIGG...
Antibody Screen: NEGATIVE
Basophils Absolute: 0 10*3/uL (ref 0.0–0.2)
Basos: 0 %
EOS (ABSOLUTE): 0 10*3/uL (ref 0.0–0.4)
Eos: 1 %
HCV Ab: NONREACTIVE
HIV Screen 4th Generation wRfx: NONREACTIVE
Hematocrit: 35.9 % (ref 34.0–46.6)
Hemoglobin: 12.1 g/dL (ref 11.1–15.9)
Hepatitis B Surface Ag: NEGATIVE
Immature Grans (Abs): 0 10*3/uL (ref 0.0–0.1)
Immature Granulocytes: 1 %
Lymphocytes Absolute: 1.8 10*3/uL (ref 0.7–3.1)
Lymphs: 30 %
MCH: 28.7 pg (ref 26.6–33.0)
MCHC: 33.7 g/dL (ref 31.5–35.7)
MCV: 85 fL (ref 79–97)
Monocytes Absolute: 0.5 10*3/uL (ref 0.1–0.9)
Monocytes: 8 %
Neutrophils Absolute: 3.7 10*3/uL (ref 1.4–7.0)
Neutrophils: 60 %
Platelets: 215 10*3/uL (ref 150–450)
RBC: 4.21 x10E6/uL (ref 3.77–5.28)
RDW: 12.5 % (ref 11.7–15.4)
RPR Ser Ql: NONREACTIVE
Rh Factor: POSITIVE
Rubella Antibodies, IGG: 12.7 index (ref >0.99)
WBC: 6.1 10*3/uL (ref 3.4–10.8)

## 2022-09-24 LAB — HEMOGLOBIN A1C
Est. average glucose Bld gHb Est-mCnc: 108 mg/dL
Hgb A1c MFr Bld: 5.4 % (ref 4.8–5.6)

## 2022-09-24 LAB — URINE CULTURE, OB REFLEX

## 2022-09-24 LAB — CULTURE, OB URINE

## 2022-09-25 ENCOUNTER — Encounter: Payer: Self-pay | Admitting: *Deleted

## 2022-09-25 ENCOUNTER — Ambulatory Visit: Payer: Medicaid Other | Admitting: *Deleted

## 2022-09-25 ENCOUNTER — Other Ambulatory Visit: Payer: Self-pay | Admitting: *Deleted

## 2022-09-25 ENCOUNTER — Ambulatory Visit: Payer: Medicaid Other | Attending: Family Medicine

## 2022-09-25 VITALS — BP 123/59 | HR 86

## 2022-09-25 DIAGNOSIS — O09522 Supervision of elderly multigravida, second trimester: Secondary | ICD-10-CM

## 2022-09-25 DIAGNOSIS — O0990 Supervision of high risk pregnancy, unspecified, unspecified trimester: Secondary | ICD-10-CM | POA: Insufficient documentation

## 2022-09-25 DIAGNOSIS — O99212 Obesity complicating pregnancy, second trimester: Secondary | ICD-10-CM

## 2022-09-25 DIAGNOSIS — O09899 Supervision of other high risk pregnancies, unspecified trimester: Secondary | ICD-10-CM | POA: Insufficient documentation

## 2022-09-25 DIAGNOSIS — O09892 Supervision of other high risk pregnancies, second trimester: Secondary | ICD-10-CM

## 2022-09-28 ENCOUNTER — Other Ambulatory Visit: Payer: Self-pay

## 2022-09-28 ENCOUNTER — Encounter: Payer: Self-pay | Admitting: *Deleted

## 2022-09-28 ENCOUNTER — Ambulatory Visit (INDEPENDENT_AMBULATORY_CARE_PROVIDER_SITE_OTHER): Payer: Medicaid Other | Admitting: Advanced Practice Midwife

## 2022-09-28 VITALS — BP 116/70 | HR 82 | Wt 248.2 lb

## 2022-09-28 DIAGNOSIS — O09523 Supervision of elderly multigravida, third trimester: Secondary | ICD-10-CM

## 2022-09-28 DIAGNOSIS — O09522 Supervision of elderly multigravida, second trimester: Secondary | ICD-10-CM

## 2022-09-28 DIAGNOSIS — K219 Gastro-esophageal reflux disease without esophagitis: Secondary | ICD-10-CM

## 2022-09-28 DIAGNOSIS — E669 Obesity, unspecified: Secondary | ICD-10-CM

## 2022-09-28 DIAGNOSIS — Z3A20 20 weeks gestation of pregnancy: Secondary | ICD-10-CM

## 2022-09-28 DIAGNOSIS — O09892 Supervision of other high risk pregnancies, second trimester: Secondary | ICD-10-CM

## 2022-09-28 DIAGNOSIS — O99612 Diseases of the digestive system complicating pregnancy, second trimester: Secondary | ICD-10-CM

## 2022-09-28 DIAGNOSIS — Z6830 Body mass index (BMI) 30.0-30.9, adult: Secondary | ICD-10-CM

## 2022-09-28 DIAGNOSIS — O09893 Supervision of other high risk pregnancies, third trimester: Secondary | ICD-10-CM

## 2022-09-28 DIAGNOSIS — O99613 Diseases of the digestive system complicating pregnancy, third trimester: Secondary | ICD-10-CM

## 2022-09-28 DIAGNOSIS — O09899 Supervision of other high risk pregnancies, unspecified trimester: Secondary | ICD-10-CM

## 2022-09-28 DIAGNOSIS — E661 Drug-induced obesity: Secondary | ICD-10-CM

## 2022-09-28 MED ORDER — ASPIRIN 81 MG PO TBEC: 81.0000 mg | DELAYED_RELEASE_TABLET | Freq: Every day | ORAL | 2 refills | Status: DC

## 2022-09-29 MED ORDER — FAMOTIDINE 20 MG PO TABS: 20.0000 mg | ORAL_TABLET | Freq: Two times a day (BID) | ORAL | 3 refills | Status: DC

## 2022-09-29 NOTE — Progress Notes (Signed)
PRENATAL VISIT NOTE  Subjective:  Sara Barrett is a 36 y.o. G3P2002 at [redacted]w[redacted]d being seen today for ongoing prenatal care.  She is currently monitored for the following issues for this high-risk pregnancy and has Short interval between pregnancies affecting pregnancy in second trimester, antepartum; AMA (advanced maternal age) multigravida 35+, second trimester; and Supervision of other high risk pregnancy, antepartum on their problem list.  Patient reports heartburn, no bleeding, and no cramping.  Contractions: Not present.  .  Movement: Present. Denies leaking of fluid.   The following portions of the patient's history were reviewed and updated as appropriate: allergies, current medications, past family history, past medical history, past social history, past surgical history and problem list.   Objective:   Vitals:   09/28/22 1658  BP: 116/70  Pulse: 82  Weight: 248 lb 3.2 oz (112.6 kg)    Fetal Status: Fetal Heart Rate (bpm): 148 Fundal Height: 20 cm Movement: Present     General:  Alert, oriented and cooperative. Patient is in no acute distress.  Skin: Skin is warm and dry. No rash noted.   Cardiovascular: Normal heart rate noted  Respiratory: Normal respiratory effort, no problems with respiration noted  Abdomen: Soft, gravid, appropriate for gestational age.  Pain/Pressure: Absent     Pelvic: Cervical exam deferred        Extremities: Normal range of motion.     Mental Status: Normal mood and affect. Normal behavior. Normal judgment and thought content.   Assessment and Plan:  Pregnancy: G3P2002 at [redacted]w[redacted]d 1. Short interval between pregnancies affecting pregnancy in second trimester, antepartum - PTL precautions. Nml CL on [place].  2. AMA (advanced maternal age) multigravida 35+, second trimester - Antenatal testing pr MFM - aspirin EC 81 MG tablet; Take 1 tablet (81 mg total) by mouth daily. Take after 12 weeks for prevention of preeclampssia later in pregnancy  Dispense:  300 tablet; Refill: 2  3. Supervision of other high risk pregnancy, antepartum   4. Class 1 obesity with body mass index (BMI) of 30.0 to 30.9 in adult, unspecified obesity type, unspecified whether serious comorbidity present - ASA  5. Gastroesophageal reflux during pregnancy, antepartum, second trimester  - famotidine (PEPCID) 20 MG tablet; Take 1 tablet (20 mg total) by mouth 2 (two) times daily.  Dispense: 60 tablet; Refill: 3  Preterm labor symptoms and general obstetric precautions including but not limited to vaginal bleeding, contractions, leaking of fluid and fetal movement were reviewed in detail with the patient. Please refer to After Visit Summary for other counseling recommendations.     Centering Pregnancy, Session#1: Introduction to model of care. Group determined rules for self-governance and closing phrase. Oriented group to space and mother's notebook.   Facilitated discussion today:  common discomforts, When to call practice  Mindfulness activity completed as well as introduction to deep breathing for childbirth preparation- Centering 3 Breaths  Fundal height and FHR appropriate today unless noted otherwise in plan of care. Patient to continue group care.    Return for CenteringPregnancy as scheduled.  Future Appointments  Date Time Provider Department Center  10/24/2022  8:30 AM [facility] NURSE [facility]  10/24/2022  8:45 AM [facility] US4 [facility]  10/26/2022  9:00 AM CENTERING PROVIDER [facility]  11/23/2022  9:00 AM CENTERING PROVIDER [facility]  12/21/2022  9:00 AM CENTERING PROVIDER [facility]  01/04/2023  9:00 AM CENTERING PROVIDER [facility]  01/18/2023  9:00 AM CENTERING PROVIDER [facility]  02/01/2023  9:00 AM CENTERING  PROVIDER [facility]  02/15/2023  9:00 AM CENTERING PROVIDER [facility]  03/01/2023  9:00 AM CENTERING PROVIDER [facility]  03/15/2023  9:00 AM CENTERING PROVIDER [facility] [place], CNM

## 2022-09-30 LAB — PANORAMA PRENATAL TEST FULL PANEL:PANORAMA TEST PLUS 5 ADDITIONAL MICRODELETIONS: FETAL FRACTION: 2.9

## 2022-10-03 LAB — HORIZON CUSTOM: REPORT SUMMARY: POSITIVE — AB

## 2022-10-04 ENCOUNTER — Encounter: Payer: Self-pay | Admitting: Family Medicine

## 2022-10-04 ENCOUNTER — Encounter: Payer: Self-pay | Admitting: Lactation Services

## 2022-10-24 ENCOUNTER — Ambulatory Visit: Payer: Medicaid Other | Admitting: *Deleted

## 2022-10-24 ENCOUNTER — Ambulatory Visit: Payer: Medicaid Other | Attending: Obstetrics

## 2022-10-24 ENCOUNTER — Other Ambulatory Visit: Payer: Self-pay | Admitting: *Deleted

## 2022-10-24 VITALS — BP 118/63 | HR 74

## 2022-10-24 DIAGNOSIS — O09522 Supervision of elderly multigravida, second trimester: Secondary | ICD-10-CM

## 2022-10-24 DIAGNOSIS — O09899 Supervision of other high risk pregnancies, unspecified trimester: Secondary | ICD-10-CM | POA: Diagnosis present

## 2022-10-24 DIAGNOSIS — O99212 Obesity complicating pregnancy, second trimester: Secondary | ICD-10-CM | POA: Diagnosis not present

## 2022-10-24 DIAGNOSIS — O09892 Supervision of other high risk pregnancies, second trimester: Secondary | ICD-10-CM | POA: Insufficient documentation

## 2022-10-24 DIAGNOSIS — O283 Abnormal ultrasonic finding on antenatal screening of mother: Secondary | ICD-10-CM

## 2022-10-24 DIAGNOSIS — O285 Abnormal chromosomal and genetic finding on antenatal screening of mother: Secondary | ICD-10-CM

## 2022-10-24 DIAGNOSIS — E669 Obesity, unspecified: Secondary | ICD-10-CM

## 2022-10-24 DIAGNOSIS — D563 Thalassemia minor: Secondary | ICD-10-CM

## 2022-10-24 DIAGNOSIS — Z3A23 23 weeks gestation of pregnancy: Secondary | ICD-10-CM

## 2022-10-26 ENCOUNTER — Ambulatory Visit (INDEPENDENT_AMBULATORY_CARE_PROVIDER_SITE_OTHER): Payer: Medicaid Other | Admitting: Advanced Practice Midwife

## 2022-10-26 VITALS — BP 121/61 | HR 83 | Wt 248.4 lb

## 2022-10-26 DIAGNOSIS — O09899 Supervision of other high risk pregnancies, unspecified trimester: Secondary | ICD-10-CM

## 2022-10-26 DIAGNOSIS — O09522 Supervision of elderly multigravida, second trimester: Secondary | ICD-10-CM

## 2022-10-26 DIAGNOSIS — D563 Thalassemia minor: Secondary | ICD-10-CM

## 2022-10-26 DIAGNOSIS — O09892 Supervision of other high risk pregnancies, second trimester: Secondary | ICD-10-CM

## 2022-10-26 DIAGNOSIS — Z3A24 24 weeks gestation of pregnancy: Secondary | ICD-10-CM

## 2022-10-26 NOTE — Progress Notes (Signed)
PRENATAL VISIT NOTE  Subjective:  Sara Barrett is a 36 y.o. G3P2002 at 33w0dbeing seen today for ongoing prenatal care.  She is currently monitored for the following issues for this high-risk pregnancy and has Short interval between pregnancies affecting pregnancy in second trimester, antepartum; AMA (advanced maternal age) multigravida 35+, second trimester; and Supervision of other high risk pregnancy, antepartum on their problem list.  Patient reports no complaints.  Contractions: Not present.  .  Movement: Present. Denies leaking of fluid.   The following portions of the patient's history were reviewed and updated as appropriate: allergies, current medications, past family history, past medical history, past social history, past surgical history and problem list.   Objective:   Vitals:   10/26/22 1546  BP: 121/61  Pulse: 83  Weight: 248 lb 6.4 oz (112.7 kg)    Fetal Status: Fetal Heart Rate (bpm): 147 Fundal Height: 24 cm Movement: Present     General:  Alert, oriented and cooperative. Patient is in no acute distress.  Skin: Skin is warm and dry. No rash noted.   Cardiovascular: Normal heart rate noted  Respiratory: Normal respiratory effort, no problems with respiration noted  Abdomen: Soft, gravid, appropriate for gestational age.  Pain/Pressure: Absent     Pelvic: Cervical exam deferred        Extremities: Normal range of motion.  Edema: None  Mental Status: Normal mood and affect. Normal behavior. Normal judgment and thought content.   Assessment and Plan:  Pregnancy: G3P2002 at 286w0d. Short interval between pregnancies affecting pregnancy in second trimester, antepartum - PTL precautions  2. AMA (advanced maternal age) multigravida 3512+second trimester - Antenatal testing Per MFM  3. Supervision of other high risk pregnancy, antepartum - CenteringPregnancy - Anatomy USKoreaml except for EFI. NIPS Nml. F/U 2/20. - 28 week labs scheduled  4. [redacted] weeks gestation  of pregnancy   5. Alpha thalassemia silent carrier - Wants kit but we are out. [employer] number given.   Preterm labor symptoms and general obstetric precautions including but not limited to vaginal bleeding, contractions, leaking of fluid and fetal movement were reviewed in detail with the patient. Please refer to After Visit Summary for other counseling recommendations.    Centering Pregnancy, Session#2: Reviewed rules for self-governance with group. Direct group to [employer]  Facilitated discussion today:  Nutrition, Back pain, Genetic screening options with AFP/Quad.   Mindfulness activity completed as well as deep breathing with 1 minutes of tension and 1 minute of relaxation for childbirth preparation.  Also participated in mindful eating activity Activity-demonstrated exercises to alleviate back pain.  Fundal height and FHR appropriate today unless noted otherwise in plan. Patient to continue group care.    Return in about 4 weeks (around 11/23/2022) for as scheduled.  Future Appointments  Date Time Provider [place]1/12/2022 10:00 AM [facility] LAB [facility]/09/2023  9:00 AM CENTERING PROVIDER [facility]/06/2023  9:00 AM CENTERING PROVIDER [facility]/20/2024  7:45 AM W[facility]-MFC NURSE [facility]FC [facility]  01/02/2023  8:00 AM [facility] US1 [facility]2/22/2024  9:00 AM CENTERING PROVIDER [facility]/05/2023  9:00 AM CENTERING PROVIDER [facility]3/21/2024  9:00 AM CENTERING PROVIDER [facility]4/02/2023  9:00 AM CENTERING PROVIDER [facility]4/18/2024  9:00 AM CENTERING PROVIDER [facility]5/12/2022  9:00 AM CENTERING PROVIDER [facility]ure[facility] HospitalMNewingtonCNM

## 2022-10-30 ENCOUNTER — Encounter: Payer: Self-pay | Admitting: *Deleted

## 2022-11-13 NOTE — L&D Delivery Note (Addendum)
Delivery Note Sara Barrett is a 37 y.o. G3P2002 at [redacted]w[redacted]d admitted for SOL.   GBS Status:  Positive/-- (03/14 1533) Maximum Maternal Temperature: 36.5C  Labor course: Initial SVE: 7/90/2. Augmentation with: N/A. She then progressed to complete quickly.  ROM: 0h 78m with clear fluid  Birth: At 1033 a viable female was delivered via spontaneous vaginal delivery (Presentation: direct OA ; restituted to ROT ). Nuchal cord present: Yes - somersault after delivery of body.  Shoulders and body delivered in usual fashion. Infant placed directly on mom's abdomen for bonding/skin-to-skin, baby dried and stimulated. Cord clamped x 2 after 1 minute and cut by FOB.  Cord blood collected.  The placenta separated spontaneously and delivered via gentle cord traction.  Pitocin infused rapidly IV per protocol.  Fundus firm with massage.  Placenta inspected and appears to be intact with a 3 VC.  Placenta/Cord with the following complications: none Sponge and instrument count were correct x2.  Intrapartum complications:  None Anesthesia:  none Episiotomy: none Lacerations:  n/a Suture Repair:  none EBL (mL): 36   Infant: APGAR (1 MIN): 8 APGAR (5 MINS): 9 Infant weight: pending  Mom to postpartum.  Baby to Couplet care / Skin to Skin. Placenta to L&D   Plans to Breastfeed Contraception: vasectomy Circumcision: N/A  Delivery was attended by Dr. Naaman Plummer Autry-Lott. Sara Barrett "[name], M.D. PGY-2 Family Medicine Visiting Resident Faculty Practice 02/06/2023 11:23 AM   GME ATTESTATION:  I agree with the findings and the plan of care as documented in the resident's note. I have made changes to documentation as necessary.  Gerlene Fee, DO OB Fellow, [place] [facility] [place] 02/06/2023, 11:27 AM

## 2022-11-14 ENCOUNTER — Other Ambulatory Visit: Payer: Self-pay

## 2022-11-14 ENCOUNTER — Other Ambulatory Visit: Payer: Medicaid Other

## 2022-11-23 ENCOUNTER — Other Ambulatory Visit: Payer: Medicaid Other

## 2022-11-23 ENCOUNTER — Other Ambulatory Visit: Payer: Self-pay

## 2022-11-23 ENCOUNTER — Ambulatory Visit: Payer: Medicaid Other | Admitting: Advanced Practice Midwife

## 2022-11-23 VITALS — BP 121/66 | HR 86 | Wt 254.0 lb

## 2022-11-23 DIAGNOSIS — Z3A28 28 weeks gestation of pregnancy: Secondary | ICD-10-CM

## 2022-11-23 DIAGNOSIS — O09522 Supervision of elderly multigravida, second trimester: Secondary | ICD-10-CM

## 2022-11-23 DIAGNOSIS — O09899 Supervision of other high risk pregnancies, unspecified trimester: Secondary | ICD-10-CM

## 2022-11-23 DIAGNOSIS — O09892 Supervision of other high risk pregnancies, second trimester: Secondary | ICD-10-CM

## 2022-11-23 NOTE — Progress Notes (Addendum)
   PRENATAL VISIT NOTE  Subjective:  Sara Barrett is a 37 y.o. G3P2002 at [redacted]w[redacted]d being seen today for ongoing prenatal care.  She is currently monitored for the following issues for this high-risk pregnancy and has Short interval between pregnancies affecting pregnancy in second trimester, antepartum; AMA (advanced maternal age) multigravida 35+, second trimester; and Supervision of other high risk pregnancy, antepartum on their problem list.  Patient reports no complaints.  Contractions: Not present. Vag. Bleeding: None.  Movement: Present. Denies leaking of fluid.   The following portions of the patient's history were reviewed and updated as appropriate: allergies, current medications, past family history, past medical history, past social history, past surgical history and problem list.   Objective:   Vitals:   11/23/22 0919  BP: 121/66  Pulse: 86  Weight: 254 lb (115.2 kg)    Fetal Status: Fetal Heart Rate (bpm): 145 Fundal Height: 28 cm Movement: Present  Presentation: Vertex  General:  Alert, oriented and cooperative. Patient is in no acute distress.  Skin: Skin is warm and dry. No rash noted.   Cardiovascular: Normal heart rate noted  Respiratory: Normal respiratory effort, no problems with respiration noted  Abdomen: Soft, gravid, appropriate for gestational age.  Pain/Pressure: Present     Pelvic: Cervical exam deferred        Extremities: Normal range of motion.  Edema: None  Mental Status: Normal mood and affect. Normal behavior. Normal judgment and thought content.   Assessment and Plan:  Pregnancy: G3P2002 at [redacted]w[redacted]d 1. AMA (advanced maternal age) multigravida 74+, second trimester - Antenatal testing per MFM  2. Short interval between pregnancies affecting pregnancy in second trimester, antepartum   3. Supervision of other high risk pregnancy, antepartum - PTL precautions.  - Check vit D  4. [redacted] weeks gestation of pregnancy - 28 week labs today - TDaP info  given.   - Pt is interested in waterbirth.  No contraindications at this time per chart review/patient assessment.   - Pt to enroll in class, see CNMs for most visits in the office.  - Discussed waterbirth as option for low-risk pregnancy.  Reviewed conditions that may arise during pregnancy that will risk pt out of waterbirth including hypertension, diabetes, fetal growth restriction <10%ile, etc.   Preterm labor symptoms and general obstetric precautions including but not limited to vaginal bleeding, contractions, leaking of fluid and fetal movement were reviewed in detail with the patient. Please refer to After Visit Summary for other counseling recommendations.   Centering Pregnancy, Session#3: Reviewed resources in [employer].   Facilitated discussion today:   - Goal setting - Breastfeeding/infant nutrition - Pregnancy advice agree/disagree - Bonding with baby - Gestational Diabetes testing  Fundal height and FHR appropriate today unless noted otherwise in plan. Patient to continue group care.    Return in about 4 weeks (around 12/21/2022) for as scheduled.  Future Appointments  Date Time Provider [place]  12/21/2022  9:00 AM CENTERING PROVIDER [facility]  01/02/2023  7:45 AM [facility] NURSE [facility]  01/02/2023  8:00 AM [facility] US1 [facility]  01/04/2023  9:00 AM CENTERING PROVIDER [facility]  01/18/2023  9:00 AM CENTERING PROVIDER [facility]  02/01/2023  9:00 AM CENTERING PROVIDER [facility]  02/15/2023  9:00 AM CENTERING PROVIDER [facility]  03/01/2023  9:00 AM CENTERING PROVIDER [facility]  03/15/2023  9:00 AM CENTERING PROVIDER [facility], CNM

## 2022-11-24 LAB — RPR: RPR Ser Ql: NONREACTIVE

## 2022-11-24 LAB — CBC
Hematocrit: 36.5 % (ref 34.0–46.6)
Hemoglobin: 11.9 g/dL (ref 11.1–15.9)
MCH: 27.7 pg (ref 26.6–33.0)
MCHC: 32.6 g/dL (ref 31.5–35.7)
MCV: 85 fL (ref 79–97)
Platelets: 213 10*3/uL (ref 150–450)
RBC: 4.3 x10E6/uL (ref 3.77–5.28)
RDW: 12.8 % (ref 11.7–15.4)
WBC: 6.5 10*3/uL (ref 3.4–10.8)

## 2022-11-24 LAB — GLUCOSE TOLERANCE, 2 HOURS W/ 1HR
Glucose, 1 hour: 99 mg/dL (ref 70–179)
Glucose, 2 hour: 62 mg/dL — ABNORMAL LOW (ref 70–152)
Glucose, Fasting: 84 mg/dL (ref 70–91)

## 2022-11-24 LAB — HIV ANTIBODY (ROUTINE TESTING W REFLEX): HIV Screen 4th Generation wRfx: NONREACTIVE

## 2022-11-28 NOTE — Patient Instructions (Addendum)
Considering Waterbirth? Guide for patients at [facility] ([facility]) Why consider waterbirth? Gentle birth for babies  Less pain medicine used in labor  May allow for passive descent/less pushing  May reduce perineal tears  More mobility and instinctive maternal position changes  Increased maternal relaxation   Is waterbirth safe? What are the risks of infection, drowning or other complications? Infection:  Very low risk (3.7 % for tub vs 4.8% for bed)  7 in 8000 waterbirths with documented infection  Poorly cleaned equipment most common cause  Slightly lower group B strep transmission rate  Drowning  Maternal:  Very low risk  Related to seizures or fainting  Newborn:  Very low risk. No evidence of increased risk of respiratory problems in multiple large studies  Physiological protection from breathing under water  Avoid underwater birth if there are any fetal complications  Once baby's head is out of the water, keep it out.  Birth complication  Some reports of cord trauma, but risk decreased by bringing baby to surface gradually  No evidence of increased risk of shoulder dystocia. Mothers can usually change positions faster in water than in a bed, possibly aiding the maneuvers to free the shoulder.   There are 2 things you MUST do to have a waterbirth with [facility]: Attend a waterbirth class at [facility]   3rd Wednesday of every month from 7-9 pm (virtual during COVID) Free Register online at [email or web address] or [email or web address] or by calling [phone] Bring us the certificate from the class to your prenatal appointment or send via MyChart Meet with a midwife at 36 weeks* to see if you can still plan a waterbirth and to sign the consent.   *We also recommend that you schedule as many of your prenatal visits with a midwife as possible.    Helpful information: You may want to bring a bathing suit top to the hospital  to wear during labor but this is optional.  All other supplies are provided by the hospital. Please arrive at the hospital with signs of active labor, and do not wait at home until late in labor. It takes 45 min- 1 hour for fetal monitoring, and check in to your room to take place, plus transport and filling of the waterbirth tub.    Things that would prevent you from having a waterbirth: Premature, <37wks  Previous cesarean birth  Presence of thick meconium-stained fluid  Multiple gestation (Twins, triplets, etc.)  Uncontrolled diabetes or gestational diabetes requiring medication  Hypertension diagnosed in pregnancy or preexisting hypertension (gestational hypertension, preeclampsia, or chronic hypertension) Fetal growth restriction (your baby measures less than 10th percentile on ultrasound) Heavy vaginal bleeding  Non-reassuring fetal heart rate  Active infection (MRSA, etc.). Group B Strep is NOT a contraindication for waterbirth.  If your labor has to be induced and induction method requires continuous monitoring of the baby's heart rate  Other risks/issues identified by your obstetrical provider   Please remember that birth is unpredictable. Under certain unforeseeable circumstances your provider may advise against giving birth in the tub. These decisions will be made on a case-by-case basis and with the safety of you and your baby as our highest priority.    Updated 02/15/22   TDaP Vaccine Pregnancy Get the Whooping Cough Vaccine While You Are Pregnant (CDC)  It is important for women to get the whooping cough vaccine in the third trimester of each pregnancy. Vaccines are the best way to prevent this disease.   There are 2 different whooping cough vaccines. Both vaccines combine protection against whooping cough, tetanus and diphtheria, but they are for different age groups: Tdap: for everyone 11 years or older, including pregnant women  DTaP: for children 2 months through 6 years of  age  You need the whooping cough vaccine during each of your pregnancies The recommended time to get the shot is during your 27th through 36th week of pregnancy, preferably during the earlier part of this time period. The Centers for Disease Control and Prevention (CDC) recommends that pregnant women receive the whooping cough vaccine for adolescents and adults (called Tdap vaccine) during the third trimester of each pregnancy. The recommended time to get the shot is during your 27th through 36th week of pregnancy, preferably during the earlier part of this time period. This replaces the original recommendation that pregnant women get the vaccine only if they had not previously received it. The [employer] of [employer]stetricians and Gynecologists and the [employer] support this recommendation.  You should get the whooping cough vaccine while pregnant to pass protection to your baby frame support disabled and/or not supported in this browser  Learn why Sara Barrett decided to get the whooping cough vaccine in her 3rd trimester of pregnancy and how her baby girl was born with some protection against the disease. Also available on YouTube. After receiving the whooping cough vaccine, your body will create protective antibodies (proteins produced by the body to fight off diseases) and pass some of them to your baby before birth. These antibodies provide your baby some short-term protection against whooping cough in early life. These antibodies can also protect your baby from some of the more serious complications that come along with whooping cough. Your protective antibodies are at their highest about 2 weeks after getting the vaccine, but it takes time to pass them to your baby. So the preferred time to get the whooping cough vaccine is early in your third trimester. The amount of whooping cough antibodies in your body decreases over time. That is why CDC recommends you get a whooping cough  vaccine during each pregnancy. Doing so allows each of your babies to get the greatest number of protective antibodies from you. This means each of your babies will get the best protection possible against this disease.  Getting the whooping cough vaccine while pregnant is better than getting the vaccine after you give birth Whooping cough vaccination during pregnancy is ideal so your baby will have short-term protection as soon as he is born. This early protection is important because your baby will not start getting his whooping cough vaccines until he is 2 months old. These first few months of life are when your baby is at greatest risk for catching whooping cough. This is also when he's at greatest risk for having severe, potentially life-threating complications from the infection. To avoid that gap in protection, it is best to get a whooping cough vaccine during pregnancy. You will then pass protection to your baby before he is born. To continue protecting your baby, he should get whooping cough vaccines starting at 2 months old. You may never have gotten the Tdap vaccine before and did not get it during this pregnancy. If so, you should make sure to get the vaccine immediately after you give birth, before leaving the hospital or birthing center. It will take about 2 weeks before your body develops protection (antibodies) in response to the vaccine. Once you have protection from the vaccine,  you are less likely to give whooping cough to your newborn while caring for him. But remember, your baby will still be at risk for catching whooping cough from others. A recent study looked to see how effective Tdap was at preventing whooping cough in babies whose mothers got the vaccine while pregnant or in the hospital after giving birth. The study found that getting Tdap between 27 through 36 weeks of pregnancy is 85% more effective at preventing whooping cough in babies younger than 2 months old. Blood tests  cannot tell if you need a whooping cough vaccine There are no blood tests that can tell you if you have enough antibodies in your body to protect yourself or your baby against whooping cough. Even if you have been sick with whooping cough in the past or previously received the vaccine, you still should get the vaccine during each pregnancy. Breastfeeding may pass some protective antibodies onto your baby By breastfeeding, you may pass some antibodies you have made in response to the vaccine to your baby. When you get a whooping cough vaccine during your pregnancy, you will have antibodies in your breast milk that you can share with your baby as soon as your milk comes in. However, your baby will not get protective antibodies immediately if you wait to get the whooping cough vaccine until after delivering your baby. This is because it takes about 2 weeks for your body to create antibodies. Learn more about the health benefits of breastfeeding.

## 2022-11-30 LAB — VITAMIN D 1,25 DIHYDROXY
Vitamin D 1, 25 (OH)2 Total: 135 pg/mL — ABNORMAL HIGH
Vitamin D2 1, 25 (OH)2: <10 pg/mL
Vitamin D3 1, 25 (OH)2: 131 pg/mL

## 2022-11-30 LAB — SPECIMEN STATUS REPORT

## 2022-12-01 ENCOUNTER — Encounter: Payer: Self-pay | Admitting: Advanced Practice Midwife

## 2022-12-21 ENCOUNTER — Other Ambulatory Visit: Payer: Self-pay

## 2022-12-21 ENCOUNTER — Ambulatory Visit: Payer: Medicaid Other

## 2022-12-21 VITALS — BP 125/80 | HR 86 | Wt 256.0 lb

## 2022-12-21 DIAGNOSIS — O09899 Supervision of other high risk pregnancies, unspecified trimester: Secondary | ICD-10-CM

## 2022-12-21 DIAGNOSIS — O09523 Supervision of elderly multigravida, third trimester: Secondary | ICD-10-CM

## 2022-12-21 DIAGNOSIS — D563 Thalassemia minor: Secondary | ICD-10-CM

## 2022-12-21 DIAGNOSIS — O09522 Supervision of elderly multigravida, second trimester: Secondary | ICD-10-CM

## 2022-12-21 DIAGNOSIS — Z23 Encounter for immunization: Secondary | ICD-10-CM | POA: Diagnosis not present

## 2022-12-21 DIAGNOSIS — O09893 Supervision of other high risk pregnancies, third trimester: Secondary | ICD-10-CM

## 2022-12-21 DIAGNOSIS — Z3A32 32 weeks gestation of pregnancy: Secondary | ICD-10-CM

## 2022-12-21 NOTE — Patient Instructions (Signed)
TDaP Vaccine Pregnancy Get the Whooping Cough Vaccine While You Are Pregnant (CDC)  It is important for women to get the whooping cough vaccine in the third trimester of each pregnancy. Vaccines are the best way to prevent this disease. There are 2 different whooping cough vaccines. Both vaccines combine protection against whooping cough, tetanus and diphtheria, but they are for different age groups: Tdap: for everyone 11 years or older, including pregnant women  DTaP: for children 2 months through 64 years of age  You need the whooping cough vaccine during each of your pregnancies The recommended time to get the shot is during your 27th through 36th week of pregnancy, preferably during the earlier part of this time period. The Centers for Disease Control and Prevention (CDC) recommends that pregnant women receive the whooping cough vaccine for adolescents and adults (called Tdap vaccine) during the third trimester of each pregnancy. The recommended time to get the shot is during your 27th through 36th week of pregnancy, preferably during the earlier part of this time period. This replaces the original recommendation that pregnant women get the vaccine only if they had not previously received it. The [employer] of [employer]st[employer]ians and Gynecologists and the [employer] support this recommendation.  You should get the whooping cough vaccine while pregnant to pass protection to your baby frame support disabled and/or not supported in this browser  Learn why Sara Barrett decided to get the whooping cough vaccine in her 3rd trimester of pregnancy and how her baby girl was born with some protection against the disease. Also available on YouTube. After receiving the whooping cough vaccine, your body will create protective antibodies (proteins produced by the body to fight off diseases) and pass some of them to your baby before birth. These antibodies provide your baby some short-term  protection against whooping cough in early life. These antibodies can also protect your baby from some of the more serious complications that come along with whooping cough. Your protective antibodies are at their highest about 2 weeks after getting the vaccine, but it takes time to pass them to your baby. So the preferred time to get the whooping cough vaccine is early in your third trimester. The amount of whooping cough antibodies in your body decreases over time. That is why CDC recommends you get a whooping cough vaccine during each pregnancy. Doing so allows each of your babies to get the greatest number of protective antibodies from you. This means each of your babies will get the best protection possible against this disease.  Getting the whooping cough vaccine while pregnant is better than getting the vaccine after you give birth Whooping cough vaccination during pregnancy is ideal so your baby will have short-term protection as soon as he is born. This early protection is important because your baby will not start getting his whooping cough vaccines until he is 2 months old. These first few months of life are when your baby is at greatest risk for catching whooping cough. This is also when he's at greatest risk for having severe, potentially life-threating complications from the infection. To avoid that gap in protection, it is best to get a whooping cough vaccine during pregnancy. You will then pass protection to your baby before he is born. To continue protecting your baby, he should get whooping cough vaccines starting at 2 months old. You may never have gotten the Tdap vaccine before and did not get it during this pregnancy. If so, you should make sure  to get the vaccine immediately after you give birth, before leaving the hospital or birthing center. It will take about 2 weeks before your body develops protection (antibodies) in response to the vaccine. Once you have protection from the vaccine,  you are less likely to give whooping cough to your newborn while caring for him. But remember, your baby will still be at risk for catching whooping cough from others. A recent study looked to see how effective Tdap was at preventing whooping cough in babies whose mothers got the vaccine while pregnant or in the hospital after giving birth. The study found that getting Tdap between 27 through 36 weeks of pregnancy is 85% more effective at preventing whooping cough in babies younger than 2 months old. Blood tests cannot tell if you need a whooping cough vaccine There are no blood tests that can tell you if you have enough antibodies in your body to protect yourself or your baby against whooping cough. Even if you have been sick with whooping cough in the past or previously received the vaccine, you still should get the vaccine during each pregnancy. Breastfeeding may pass some protective antibodies onto your baby By breastfeeding, you may pass some antibodies you have made in response to the vaccine to your baby. When you get a whooping cough vaccine during your pregnancy, you will have antibodies in your breast milk that you can share with your baby as soon as your milk comes in. However, your baby will not get protective antibodies immediately if you wait to get the whooping cough vaccine until after delivering your baby. This is because it takes about 2 weeks for your body to create antibodies. Learn more about the health benefits of breastfeeding.  

## 2022-12-21 NOTE — Progress Notes (Signed)
   PRENATAL VISIT NOTE  Subjective:  Sara Barrett is a 37 y.o. G3P2002 at [redacted]w[redacted]d being seen today for ongoing prenatal care.  She is currently monitored for the following issues for this low-risk pregnancy and has Short interval between pregnancies affecting pregnancy in second trimester, antepartum; AMA (advanced maternal age) multigravida 35+, second trimester; and Supervision of other high risk pregnancy, antepartum on their problem list.  Patient reports no complaints. Requests dental letter.  Contractions: Irritability. Vag. Bleeding: None.  Movement: Present. Denies leaking of fluid.   The following portions of the patient's history were reviewed and updated as appropriate: allergies, current medications, past family history, past medical history, past social history, past surgical history and problem list.   Objective:   Vitals:   12/21/22 0911  BP: 125/80  Pulse: 86  Weight: 256 lb (116.1 kg)    Fetal Status: Fetal Heart Rate (bpm): 143 Fundal Height: 32 cm Movement: Present     General:  Alert, oriented and cooperative. Patient is in no acute distress.  Skin: Skin is warm and dry. No rash noted.   Cardiovascular: Normal heart rate noted  Respiratory: Normal respiratory effort, no problems with respiration noted  Abdomen: Soft, gravid, appropriate for gestational age.  Pain/Pressure: Present     Pelvic: Cervical exam deferred        Extremities: Normal range of motion.  Edema: Trace  Mental Status: Normal mood and affect. Normal behavior. Normal judgment and thought content.   Assessment and Plan:  Pregnancy: G3P2002 at [redacted]w[redacted]d 1. Supervision of other high risk pregnancy, antepartum - TDaP - Took Waterbirth class. Will bring certificate to [place].   2. AMA (advanced maternal age) multigravida 46+, second trimester - Antenatal testing not indicated  3. Alpha thalassemia silent carrier - Partner kit given  4. Need for diphtheria-tetanus-pertussis (Tdap) vaccine -  TDaP  5. [redacted] weeks gestation of pregnancy   Preterm labor symptoms and general obstetric precautions including but not limited to vaginal bleeding, contractions, leaking of fluid and fetal movement were reviewed in detail with the patient. Please refer to After Visit Summary for other counseling recommendations.    Centering Pregnancy, Session#4: Reviewed resources in [employer].   Facilitated discussion today:  Family dynamics/environment, family planning postpartum, and preterm labor Mindfulness activity completed. Reach Out and Read Book given. Discussed importance of reading during pregnancy for bonding and pediatric literacy.  Fundal height and FHR appropriate today unless noted otherwise in plan. Patient to continue group care.   Return in about 2 weeks (around 01/04/2023) for Centering, CenteringPregnancy as scheduled.  Future Appointments  Date Time Provider [place]  01/02/2023  7:45 AM [facility] NURSE [facility]  01/02/2023  8:00 AM [facility] US1 [facility]  01/04/2023  9:00 AM CENTERING PROVIDER [facility]  01/18/2023  9:00 AM CENTERING PROVIDER [facility]  02/01/2023  9:00 AM CENTERING PROVIDER [facility]  02/15/2023  9:00 AM CENTERING PROVIDER [facility]  03/01/2023  9:00 AM CENTERING PROVIDER [facility]  03/15/2023  9:00 AM CENTERING PROVIDER [facility], CNM

## 2023-01-02 ENCOUNTER — Ambulatory Visit: Payer: Medicaid Other | Attending: Maternal & Fetal Medicine

## 2023-01-02 ENCOUNTER — Encounter: Payer: Self-pay | Admitting: *Deleted

## 2023-01-02 ENCOUNTER — Ambulatory Visit: Payer: Medicaid Other | Admitting: *Deleted

## 2023-01-02 VITALS — BP 123/73 | HR 86

## 2023-01-02 DIAGNOSIS — O99212 Obesity complicating pregnancy, second trimester: Secondary | ICD-10-CM | POA: Diagnosis present

## 2023-01-02 DIAGNOSIS — O09522 Supervision of elderly multigravida, second trimester: Secondary | ICD-10-CM

## 2023-01-02 DIAGNOSIS — E669 Obesity, unspecified: Secondary | ICD-10-CM

## 2023-01-02 DIAGNOSIS — O283 Abnormal ultrasonic finding on antenatal screening of mother: Secondary | ICD-10-CM | POA: Insufficient documentation

## 2023-01-02 DIAGNOSIS — O09899 Supervision of other high risk pregnancies, unspecified trimester: Secondary | ICD-10-CM | POA: Insufficient documentation

## 2023-01-02 DIAGNOSIS — O285 Abnormal chromosomal and genetic finding on antenatal screening of mother: Secondary | ICD-10-CM

## 2023-01-02 DIAGNOSIS — D563 Thalassemia minor: Secondary | ICD-10-CM

## 2023-01-02 DIAGNOSIS — O99213 Obesity complicating pregnancy, third trimester: Secondary | ICD-10-CM

## 2023-01-02 DIAGNOSIS — O09892 Supervision of other high risk pregnancies, second trimester: Secondary | ICD-10-CM | POA: Insufficient documentation

## 2023-01-02 DIAGNOSIS — O09523 Supervision of elderly multigravida, third trimester: Secondary | ICD-10-CM

## 2023-01-02 DIAGNOSIS — Z3A33 33 weeks gestation of pregnancy: Secondary | ICD-10-CM

## 2023-01-03 NOTE — Progress Notes (Unsigned)
   PRENATAL VISIT NOTE  Subjective:  Sara Barrett is a 37 y.o. G3P2002 at 58w6dbeing seen today for ongoing prenatal care.  She is currently monitored for the following issues for this {Blank single:19197::"high-risk","low-risk"} pregnancy and has Short interval between pregnancies affecting pregnancy in second trimester, antepartum; AMA (advanced maternal age) multigravida 35+, second trimester; and Supervision of other high risk pregnancy, antepartum on their problem list.  Patient reports {sx:14538}.   .  .   . Denies leaking of fluid.   The following portions of the patient's history were reviewed and updated as appropriate: allergies, current medications, past family history, past medical history, past social history, past surgical history and problem list.   Objective:  There were no vitals filed for this visit.  Fetal Status:           General:  Alert, oriented and cooperative. Patient is in no acute distress.  Skin: Skin is warm and dry. No rash noted.   Cardiovascular: Normal heart rate noted  Respiratory: Normal respiratory effort, no problems with respiration noted  Abdomen: Soft, gravid, appropriate for gestational age.        Pelvic: {Blank single:19197::"Cervical exam performed in the presence of a chaperone","Cervical exam deferred"}        Extremities: Normal range of motion.     Mental Status: Normal mood and affect. Normal behavior. Normal judgment and thought content.   Assessment and Plan:  Pregnancy: G3P2002 at 366w6d. Short interval between pregnancies affecting pregnancy in second trimester, antepartum ***  2. AMA (advanced maternal age) multigravida 35+, second trimester ***  3. Supervision of other high risk pregnancy, antepartum ***  4. [redacted] weeks gestation of pregnancy ***  {Blank single:19197::"Term","Preterm"} labor symptoms and general obstetric precautions including but not limited to vaginal bleeding, contractions, leaking of fluid and fetal  movement were reviewed in detail with the patient. Please refer to After Visit Summary for other counseling recommendations.   No follow-ups on file.  Future Appointments  Date Time Provider [place]2/22/2024  9:00 AM CENTERING PROVIDER [facility]MP[facility]3/05/2023  9:00 AM CENTERING PROVIDER [facility]3/21/2024  9:00 AM CENTERING PROVIDER WMMagn[facility]CM[facility]4/02/2023  9:00 AM CENTERING PROVIDER WMS[facility]4/18/2024  9:00 AM CENTERING PROVIDER WMEminent Medical CenterMV[facility]5/12/2022  9:00 AM CENTERING PROVIDER WMWellstar Windy Hill HospitalMEdinburghCNM

## 2023-01-04 ENCOUNTER — Ambulatory Visit (INDEPENDENT_AMBULATORY_CARE_PROVIDER_SITE_OTHER): Payer: Medicaid Other | Admitting: Advanced Practice Midwife

## 2023-01-04 ENCOUNTER — Other Ambulatory Visit: Payer: Self-pay

## 2023-01-04 VITALS — BP 122/78 | HR 96 | Wt 257.4 lb

## 2023-01-04 DIAGNOSIS — R030 Elevated blood-pressure reading, without diagnosis of hypertension: Secondary | ICD-10-CM

## 2023-01-04 DIAGNOSIS — O09523 Supervision of elderly multigravida, third trimester: Secondary | ICD-10-CM

## 2023-01-04 DIAGNOSIS — O26891 Other specified pregnancy related conditions, first trimester: Secondary | ICD-10-CM | POA: Diagnosis not present

## 2023-01-04 DIAGNOSIS — O09892 Supervision of other high risk pregnancies, second trimester: Secondary | ICD-10-CM

## 2023-01-04 DIAGNOSIS — O09522 Supervision of elderly multigravida, second trimester: Secondary | ICD-10-CM

## 2023-01-04 DIAGNOSIS — O09899 Supervision of other high risk pregnancies, unspecified trimester: Secondary | ICD-10-CM

## 2023-01-04 DIAGNOSIS — O09893 Supervision of other high risk pregnancies, third trimester: Secondary | ICD-10-CM

## 2023-01-04 DIAGNOSIS — Z3A34 34 weeks gestation of pregnancy: Secondary | ICD-10-CM

## 2023-01-04 NOTE — Progress Notes (Signed)
Pt had initial BP elevation today. She stated BP was checked immediately upon arriving to [facility] and she did not sit quietly prior to BP taken. She denied H/A, blurry vision, dizziness or seeing spots. Re-check BP normal - 122/78.

## 2023-01-17 NOTE — Progress Notes (Signed)
   PRENATAL VISIT NOTE  Subjective:  Sara Barrett is a 37 y.o. G3P2002 at 46w0dbeing seen today for ongoing prenatal care.  She is currently monitored for the following issues for this {Blank single:19197::"high-risk","low-risk"} pregnancy and has Short interval between pregnancies affecting pregnancy in second trimester, antepartum; AMA (advanced maternal age) multigravida 35+, second trimester; Supervision of other high risk pregnancy, antepartum; and Transient elevated blood pressure on their problem list.  Patient reports {sx:14538}.   .  .   . Denies leaking of fluid.   The following portions of the patient's history were reviewed and updated as appropriate: allergies, current medications, past family history, past medical history, past social history, past surgical history and problem list.   Objective:  There were no vitals filed for this visit.  Fetal Status:           General:  Alert, oriented and cooperative. Patient is in no acute distress.  Skin: Skin is warm and dry. No rash noted.   Cardiovascular: Normal heart rate noted  Respiratory: Normal respiratory effort, no problems with respiration noted  Abdomen: Soft, gravid, appropriate for gestational age.        Pelvic: {Blank single:19197::"Cervical exam performed in the presence of a chaperone","Cervical exam deferred"}        Extremities: Normal range of motion.     Mental Status: Normal mood and affect. Normal behavior. Normal judgment and thought content.   Assessment and Plan:  Pregnancy: G3P2002 at 358w6d. Short interval between pregnancies affecting pregnancy in second trimester, antepartum ***  2. AMA (advanced maternal age) multigravida 35+, second trimester ***  3. Supervision of other high risk pregnancy, antepartum ***  4. Transient elevated blood pressure ***  5. [redacted] weeks gestation of pregnancy ***  {Blank single:19197::"Term","Preterm"} labor symptoms and general obstetric precautions including  but not limited to vaginal bleeding, contractions, leaking of fluid and fetal movement were reviewed in detail with the patient. Please refer to After Visit Summary for other counseling recommendations.   No follow-ups on file.  Future Appointments  Date Time Provider De[place]3/05/2023  9:00 AM CENTERING PROVIDER WMM[facility]3/14/2024  2:35 PM AjDarliss CheneyMD WMSmith [facility] HospitalM[facility]3/21/2024  9:00 AM CENTERING PROVIDER WMP[facility]3/28/2024  9:15 AM SmMinette BrineMAll[facility]4/02/2023  9:00 AM CENTERING PROVIDER WMBethes[facility] HospitalM[facility]4/18/2024  9:00 AM CENTERING PROVIDER WMEmory Ambulatory Surgery Center At Clifton RoadMD[facility]5/12/2022  9:00 AM CENTERING PROVIDER WMComanche County HospitalMLinntownCNM

## 2023-01-18 ENCOUNTER — Ambulatory Visit (INDEPENDENT_AMBULATORY_CARE_PROVIDER_SITE_OTHER): Payer: Medicaid Other | Admitting: Advanced Practice Midwife

## 2023-01-18 ENCOUNTER — Other Ambulatory Visit: Payer: Self-pay

## 2023-01-18 VITALS — BP 102/72 | HR 95 | Wt 258.2 lb

## 2023-01-18 DIAGNOSIS — O09522 Supervision of elderly multigravida, second trimester: Secondary | ICD-10-CM

## 2023-01-18 DIAGNOSIS — R030 Elevated blood-pressure reading, without diagnosis of hypertension: Secondary | ICD-10-CM

## 2023-01-18 DIAGNOSIS — O09523 Supervision of elderly multigravida, third trimester: Secondary | ICD-10-CM

## 2023-01-18 DIAGNOSIS — O09892 Supervision of other high risk pregnancies, second trimester: Secondary | ICD-10-CM

## 2023-01-18 DIAGNOSIS — O09893 Supervision of other high risk pregnancies, third trimester: Secondary | ICD-10-CM

## 2023-01-18 DIAGNOSIS — Z3A36 36 weeks gestation of pregnancy: Secondary | ICD-10-CM

## 2023-01-18 DIAGNOSIS — O09899 Supervision of other high risk pregnancies, unspecified trimester: Secondary | ICD-10-CM

## 2023-01-18 NOTE — Patient Instructions (Signed)
[email or web address]

## 2023-01-25 ENCOUNTER — Other Ambulatory Visit (HOSPITAL_COMMUNITY)
Admission: RE | Admit: 2023-01-25 | Discharge: 2023-01-25 | Disposition: A | Discharge status: Home / Self Care | Payer: Medicaid Other | Source: Ambulatory Visit | Attending: Obstetrics and Gynecology | Admitting: Obstetrics and Gynecology

## 2023-01-25 ENCOUNTER — Other Ambulatory Visit: Payer: Self-pay

## 2023-01-25 ENCOUNTER — Encounter: Payer: Self-pay | Admitting: General Practice

## 2023-01-25 ENCOUNTER — Ambulatory Visit (INDEPENDENT_AMBULATORY_CARE_PROVIDER_SITE_OTHER): Payer: Medicaid Other | Admitting: Obstetrics and Gynecology

## 2023-01-25 VITALS — BP 103/72 | HR 77 | Wt 256.0 lb

## 2023-01-25 DIAGNOSIS — Z3A37 37 weeks gestation of pregnancy: Secondary | ICD-10-CM

## 2023-01-25 DIAGNOSIS — O09899 Supervision of other high risk pregnancies, unspecified trimester: Secondary | ICD-10-CM | POA: Insufficient documentation

## 2023-01-25 DIAGNOSIS — O09893 Supervision of other high risk pregnancies, third trimester: Secondary | ICD-10-CM

## 2023-01-25 NOTE — Progress Notes (Signed)
   PRENATAL VISIT NOTE  Subjective:  Sara Barrett is a 37 y.o. G3P2002 at [redacted]w[redacted]d being seen today for ongoing prenatal care.  She is currently monitored for the following issues for this high-risk pregnancy and has Short interval between pregnancies affecting pregnancy in second trimester, antepartum; AMA (advanced maternal age) multigravida 35+, second trimester; Supervision of other high risk pregnancy, antepartum; and Transient elevated blood pressure on their problem list.  Patient reports no complaints.  Contractions: Irregular. Vag. Bleeding: None.  Movement: Present. Denies leaking of fluid.   The following portions of the patient's history were reviewed and updated as appropriate: allergies, current medications, past family history, past medical history, past social history, past surgical history and problem list.   Objective:   Vitals:   01/25/23 1450  BP: 103/72  Pulse: 77  Weight: 256 lb (116.1 kg)    Fetal Status: Fetal Heart Rate (bpm): 139   Movement: Present     General:  Alert, oriented and cooperative. Patient is in no acute distress.  Skin: Skin is warm and dry. No rash noted.   Cardiovascular: Normal heart rate noted  Respiratory: Normal respiratory effort, no problems with respiration noted  Abdomen: Soft, gravid, appropriate for gestational age.  Pain/Pressure: Present     Pelvic: Cervical exam performed in the presence of a chaperone Dilation: Fingertip Effacement (%): 0 Station: Ballotable  Extremities: Normal range of motion.     Mental Status: Normal mood and affect. Normal behavior. Normal judgment and thought content.   Assessment and Plan:  Pregnancy: G3P2002 at [redacted]w[redacted]d 1. Supervision of other high risk pregnancy, antepartum SVE performed per request - Culture, beta strep (group b only) - Cervicovaginal ancillary only( [place])  2. [redacted] weeks gestation of pregnancy Routine swabs collected - Culture, beta strep (group b only) - Cervicovaginal  ancillary only( [place])   Term labor symptoms and general obstetric precautions including but not limited to vaginal bleeding, contractions, leaking of fluid and fetal movement were reviewed in detail with the patient. Please refer to After Visit Summary for other counseling recommendations.   No follow-ups on file.  Future Appointments  Date Time Provider [place]  02/01/2023  9:00 AM CENTERING PROVIDER [facility]  02/08/2023  9:15 AM Minette Brine [facility]  02/15/2023  9:00 AM CENTERING PROVIDER [facility]  03/01/2023  9:00 AM CENTERING PROVIDER [facility]  03/15/2023  9:00 AM CENTERING PROVIDER [facility]    Darliss Cheney, MD

## 2023-01-26 LAB — CERVICOVAGINAL ANCILLARY ONLY
Chlamydia: NEGATIVE
Comment: NEGATIVE
Comment: NEGATIVE
Comment: NORMAL
Neisseria Gonorrhea: NEGATIVE
Trichomonas: NEGATIVE

## 2023-01-30 LAB — CULTURE, BETA STREP (GROUP B ONLY): Strep Gp B Culture: POSITIVE — AB

## 2023-02-01 ENCOUNTER — Other Ambulatory Visit: Payer: Self-pay

## 2023-02-01 ENCOUNTER — Ambulatory Visit: Payer: Medicaid Other

## 2023-02-01 ENCOUNTER — Encounter: Payer: Self-pay | Admitting: *Deleted

## 2023-02-01 VITALS — BP 128/79 | HR 80 | Wt 260.2 lb

## 2023-02-01 DIAGNOSIS — Z3A38 38 weeks gestation of pregnancy: Secondary | ICD-10-CM | POA: Diagnosis not present

## 2023-02-01 DIAGNOSIS — O09893 Supervision of other high risk pregnancies, third trimester: Secondary | ICD-10-CM

## 2023-02-01 DIAGNOSIS — O09522 Supervision of elderly multigravida, second trimester: Secondary | ICD-10-CM

## 2023-02-01 DIAGNOSIS — O09899 Supervision of other high risk pregnancies, unspecified trimester: Secondary | ICD-10-CM

## 2023-02-01 DIAGNOSIS — O09892 Supervision of other high risk pregnancies, second trimester: Secondary | ICD-10-CM

## 2023-02-01 DIAGNOSIS — O09523 Supervision of elderly multigravida, third trimester: Secondary | ICD-10-CM

## 2023-02-01 DIAGNOSIS — O99820 Streptococcus B carrier state complicating pregnancy: Secondary | ICD-10-CM | POA: Insufficient documentation

## 2023-02-01 NOTE — Progress Notes (Signed)
   PRENATAL VISIT NOTE  Subjective:  Sara Barrett is a 37 y.o. G3P2002 at [redacted]w[redacted]d being seen today for ongoing prenatal care.  She is currently monitored for the following issues for this low-risk pregnancy and has Short interval between pregnancies affecting pregnancy in second trimester, antepartum; AMA (advanced maternal age) multigravida 35+, second trimester; Supervision of other high risk pregnancy, antepartum; and Transient elevated blood pressure on their problem list.  Patient reports no bleeding, no leaking, and occasional contractions.  Contractions: Not present. Vag. Bleeding: None.  Movement: Present. Denies leaking of fluid.   The following portions of the patient's history were reviewed and updated as appropriate: allergies, current medications, past family history, past medical history, past social history, past surgical history and problem list.   Objective:   Vitals:   02/01/23 0922  BP: 128/79  Pulse: 80  Weight: 260 lb 3.2 oz (118 kg)    Fetal Status: Fetal Heart Rate (bpm): 143 Fundal Height: 38 cm Movement: Present  Presentation: Vertex  General:  Alert, oriented and cooperative. Patient is in no acute distress.  Skin: Skin is warm and dry. No rash noted.   Cardiovascular: Normal heart rate noted  Respiratory: Normal respiratory effort, no problems with respiration noted  Abdomen: Soft, gravid, appropriate for gestational age.  Pain/Pressure: Present     Pelvic: Cervical exam deferred        Extremities: Normal range of motion.  Edema: None  Mental Status: Normal mood and affect. Normal behavior. Normal judgment and thought content.   Assessment and Plan:  Pregnancy: G3P2002 at [redacted]w[redacted]d 1. Short interval between pregnancies affecting pregnancy in second trimester, antepartum   2. AMA (advanced maternal age) multigravida 74+, second trimester - Nml growth [place] 33 weeks. No further testing indicated per MFM.  3. Supervision of other high risk pregnancy,  antepartum - Routine [facility] Pregnancy care  4. [redacted] weeks gestation of pregnancy   5. Group B Streptococcus carrier, antepartum - PCN in labor  Term labor symptoms and general obstetric precautions including but not limited to vaginal bleeding, contractions, leaking of fluid and fetal movement were reviewed in detail with the patient. Please refer to After Visit Summary for other counseling recommendations.    [facility] Pregnancy, Session#5: Reviewed resources in [employer].   Facilitated discussion today:  Stages of Labor, Sign of labor, labor positions, coping strategies.    Fundal height and FHR appropriate today unless noted otherwise in plan. Patient to continue group care.     No follow-ups on file.  Future Appointments  Date Time Provider [place]  02/08/2023  9:15 AM Minette Brine [facility]  02/15/2023  9:00 AM CENTERING PROVIDER [facility]  03/01/2023  9:00 AM CENTERING PROVIDER [facility]  03/15/2023  9:00 AM CENTERING PROVIDER [facility] [place]

## 2023-02-04 ENCOUNTER — Encounter: Payer: Self-pay | Admitting: Advanced Practice Midwife

## 2023-02-06 ENCOUNTER — Inpatient Hospital Stay (HOSPITAL_COMMUNITY)
Admission: AD | Admit: 2023-02-06 | Discharge: 2023-02-08 | DRG: 807 | Disposition: A | Discharge status: Home / Self Care | Payer: Medicaid Other | Attending: Family Medicine | Admitting: Family Medicine

## 2023-02-06 ENCOUNTER — Other Ambulatory Visit: Payer: Self-pay

## 2023-02-06 ENCOUNTER — Encounter (HOSPITAL_COMMUNITY): Payer: Self-pay | Admitting: Family Medicine

## 2023-02-06 DIAGNOSIS — O99824 Streptococcus B carrier state complicating childbirth: Secondary | ICD-10-CM | POA: Diagnosis present

## 2023-02-06 DIAGNOSIS — Z3A38 38 weeks gestation of pregnancy: Secondary | ICD-10-CM

## 2023-02-06 DIAGNOSIS — O99820 Streptococcus B carrier state complicating pregnancy: Secondary | ICD-10-CM

## 2023-02-06 DIAGNOSIS — O09899 Supervision of other high risk pregnancies, unspecified trimester: Secondary | ICD-10-CM

## 2023-02-06 DIAGNOSIS — O09522 Supervision of elderly multigravida, second trimester: Secondary | ICD-10-CM

## 2023-02-06 DIAGNOSIS — O09892 Supervision of other high risk pregnancies, second trimester: Principal | ICD-10-CM

## 2023-02-06 DIAGNOSIS — O09523 Supervision of elderly multigravida, third trimester: Secondary | ICD-10-CM | POA: Diagnosis not present

## 2023-02-06 DIAGNOSIS — O26893 Other specified pregnancy related conditions, third trimester: Secondary | ICD-10-CM | POA: Diagnosis present

## 2023-02-06 DIAGNOSIS — Z7982 Long term (current) use of aspirin: Secondary | ICD-10-CM | POA: Diagnosis not present

## 2023-02-06 DIAGNOSIS — Z5986 Financial insecurity: Secondary | ICD-10-CM | POA: Diagnosis not present

## 2023-02-06 LAB — CBC
HCT: 39.8 % (ref 36.0–46.0)
Hemoglobin: 13.3 g/dL (ref 12.0–15.0)
MCH: 27.9 pg (ref 26.0–34.0)
MCHC: 33.4 g/dL (ref 30.0–36.0)
MCV: 83.6 fL (ref 80.0–100.0)
Platelets: 239 10*3/uL (ref 150–400)
RBC: 4.76 MIL/uL (ref 3.87–5.11)
RDW: 13 % (ref 11.5–15.5)
WBC: 6.3 10*3/uL (ref 4.0–10.5)
nRBC: 0 % (ref 0.0–0.2)

## 2023-02-06 LAB — TYPE AND SCREEN
ABO/RH(D): B POS
Antibody Screen: NEGATIVE

## 2023-02-06 LAB — RPR: RPR Ser Ql: NONREACTIVE

## 2023-02-06 MED ORDER — SODIUM CHLORIDE 0.9 % IV SOLN
2.0000 g | Freq: Once | INTRAVENOUS | Status: AC
  Administered 2023-02-06: 2 g via INTRAVENOUS
  Filled 2023-02-06 (×2): qty 2000

## 2023-02-06 MED ORDER — PRENATAL MULTIVITAMIN CH
1.0000 | ORAL_TABLET | Freq: Every day | ORAL | Status: DC
  Administered 2023-02-06 – 2023-02-08 (×3): 1 via ORAL
  Filled 2023-02-06 (×3): qty 1

## 2023-02-06 MED ORDER — ONDANSETRON HCL 4 MG/2ML IJ SOLN: 4.0000 mg | Freq: Four times a day (QID) | INTRAMUSCULAR | Status: DC | PRN

## 2023-02-06 MED ORDER — OXYCODONE-ACETAMINOPHEN 5-325 MG PO TABS: 2.0000 | ORAL_TABLET | ORAL | Status: DC | PRN

## 2023-02-06 MED ORDER — OXYTOCIN BOLUS FROM INFUSION
333.0000 mL | Freq: Once | INTRAVENOUS | Status: AC
  Administered 2023-02-06: 333 mL via INTRAVENOUS

## 2023-02-06 MED ORDER — ACETAMINOPHEN 325 MG PO TABS: 650.0000 mg | ORAL_TABLET | ORAL | Status: DC | PRN

## 2023-02-06 MED ORDER — DIPHENHYDRAMINE HCL 25 MG PO CAPS: 25.0000 mg | ORAL_CAPSULE | Freq: Four times a day (QID) | ORAL | Status: DC | PRN

## 2023-02-06 MED ORDER — ONDANSETRON HCL 4 MG/2ML IJ SOLN: 4.0000 mg | INTRAMUSCULAR | Status: DC | PRN

## 2023-02-06 MED ORDER — BENZOCAINE-MENTHOL 20-0.5 % EX AERO: 1.0000 | INHALATION_SPRAY | CUTANEOUS | Status: DC | PRN

## 2023-02-06 MED ORDER — LACTATED RINGERS IV SOLN: INTRAVENOUS | Status: DC

## 2023-02-06 MED ORDER — SODIUM CHLORIDE 0.9 % IV SOLN: 1.0000 g | INTRAVENOUS | Status: DC

## 2023-02-06 MED ORDER — ONDANSETRON HCL 4 MG PO TABS: 4.0000 mg | ORAL_TABLET | ORAL | Status: DC | PRN

## 2023-02-06 MED ORDER — OXYCODONE-ACETAMINOPHEN 5-325 MG PO TABS: 1.0000 | ORAL_TABLET | ORAL | Status: DC | PRN

## 2023-02-06 MED ORDER — LIDOCAINE HCL (PF) 1 % IJ SOLN: 30.0000 mL | INTRAMUSCULAR | Status: DC | PRN

## 2023-02-06 MED ORDER — ACETAMINOPHEN 325 MG PO TABS
650.0000 mg | ORAL_TABLET | ORAL | Status: DC | PRN
  Administered 2023-02-07: 650 mg via ORAL
  Filled 2023-02-06: qty 2

## 2023-02-06 MED ORDER — SOD CITRATE-CITRIC ACID 500-334 MG/5ML PO SOLN: 30.0000 mL | ORAL | Status: DC | PRN

## 2023-02-06 MED ORDER — IBUPROFEN 600 MG PO TABS
600.0000 mg | ORAL_TABLET | Freq: Four times a day (QID) | ORAL | Status: DC
  Administered 2023-02-06 – 2023-02-08 (×8): 600 mg via ORAL
  Filled 2023-02-06 (×8): qty 1

## 2023-02-06 MED ORDER — COCONUT OIL OIL: 1.0000 | TOPICAL_OIL | Status: DC | PRN

## 2023-02-06 MED ORDER — ZOLPIDEM TARTRATE 5 MG PO TABS: 5.0000 mg | ORAL_TABLET | Freq: Every evening | ORAL | Status: DC | PRN

## 2023-02-06 MED ORDER — LACTATED RINGERS IV SOLN: 500.0000 mL | INTRAVENOUS | Status: DC | PRN

## 2023-02-06 MED ORDER — SIMETHICONE 80 MG PO CHEW: 80.0000 mg | CHEWABLE_TABLET | ORAL | Status: DC | PRN

## 2023-02-06 MED ORDER — OXYTOCIN-SODIUM CHLORIDE 30-0.9 UT/500ML-% IV SOLN
2.5000 [IU]/h | INTRAVENOUS | Status: DC
  Administered 2023-02-06: 2.5 [IU]/h via INTRAVENOUS
  Filled 2023-02-06: qty 500

## 2023-02-06 MED ORDER — DIBUCAINE (PERIANAL) 1 % EX OINT: 1.0000 | TOPICAL_OINTMENT | CUTANEOUS | Status: DC | PRN

## 2023-02-06 MED ORDER — WITCH HAZEL-GLYCERIN EX PADS: 1.0000 | MEDICATED_PAD | CUTANEOUS | Status: DC | PRN

## 2023-02-06 MED ORDER — SENNOSIDES-DOCUSATE SODIUM 8.6-50 MG PO TABS
2.0000 | ORAL_TABLET | Freq: Every day | ORAL | Status: DC
  Filled 2023-02-06 (×2): qty 2

## 2023-02-06 NOTE — H&P (Addendum)
OBSTETRIC ADMISSION HISTORY AND PHYSICAL  Sara Barrett is a 37 y.o. female G3P2002 with IUP at [redacted]w[redacted]d by LMP c/w 10wk [place] presenting for labor.  Labor started around 0645. She has painful contractions and reports a history of fast labors. She reports +FMs, No LOF, no VB, no blurry vision, headaches or peripheral edema, and RUQ pain.  She plans on breast feeding. She declined birth control as partner opted for vasectomy. She received her prenatal care at  [facility] [place] [facility]    Dating: By LMP c/w 10wk [place] --->  Estimated Date of Delivery: 02/15/23  Sono:    @[redacted]w[redacted]d , normal anatomy except for  intracardiac echogenic focus was noted  in the left ventricle of the fetal heart, cephalic presentation, posterior placenta, 54% EFW @[redacted]w[redacted]d , EFW 15%, AC 25%, HC < 1%  Prenatal History/Complications:  - AMA - Short interval pregnancy - GBS pos - silent alpha thal carrier - fetal US finding of EIF, isolated, low risk NIPT  Past Medical History: Past Medical History:  Diagnosis Date   No pertinent past medical history    Vaginal Pap smear, abnormal     Past Surgical History: Past Surgical History:  Procedure Laterality Date   denies surgical history     WISDOM TOOTH EXTRACTION      Obstetrical History: OB History  Gravida Para Term Preterm AB Living  3 2 2     2   SAB IAB Ectopic Multiple Live Births          2    # Outcome Date GA Lbr Len/2nd Weight Sex Delivery Anes PTL Lv  3 Current           2 Term 10/09/21 [redacted]w[redacted]d   F Vag-Spont  N LIV  1 Term 09/06/15 [redacted]w[redacted]d  2722 g M Vag-Spont        Birth Comments: Born in [place] History Social History   Socioeconomic History   Marital status: Married    Spouse name: Not on file   Number of children: 1   Years of education: 15   Highest education level: Master's degree (e.g., MA, MS, MEng, MEd, MSW, MBA)  Occupational History   Not on file  Tobacco Use   Smoking status: Never   Smokeless tobacco: Never  Vaping  Use   Vaping Use: Never used  Substance and Sexual Activity   Alcohol use: Yes    Comment: Rare ETOH use (last ETOH use was Christmas as a holiday)   Drug use: Never   Sexual activity: Yes    Birth control/protection: None  Other Topics Concern   Not on file  Social History Narrative   Not on file   Social Determinants of Health   Financial Resource Strain: Medium Risk (11/22/2022)   Overall Financial Resource Strain (CARDIA)    Difficulty of Paying Living Expenses: Somewhat hard  Food Insecurity: Food Insecurity Present (11/22/2022)   Hunger Vital Sign    Worried About Running Out of Food in the Last Year: Sometimes true    Ran Out of Food in the Last Year: Sometimes true  Transportation Needs: No Transportation Needs (11/22/2022)   PRAPARE - Hydrologist (Medical): No    Lack of Transportation (Non-Medical): No  Physical Activity: Insufficiently Active (11/22/2022)   Exercise Vital Sign    Days of Exercise per Week: 1 day    Minutes of Exercise per Session: 30 min  Stress: No Stress Concern Present (11/22/2022)  [employer] - Occupational Stress Questionnaire    Feeling of Stress : Not at all  Social Connections: Moderately Isolated (11/22/2022)   Social Connection and Isolation Panel [NHANES]    Frequency of Communication with Friends and Family: Once a week    Frequency of Social Gatherings with Friends and Family: Once a week    Attends Religious Services: More than 4 times per year    Active Member of [employer] or Organizations: No    Attends Music therapist: Not on file    Marital Status: Married    Family History: Family History  Problem Relation Age of Onset   Cancer Mother 19   Heart disease Mother    Diabetes Father    Heart attack Father    Diabetes Brother     Allergies: No Known Allergies  Medications Prior to Admission  Medication Sig Dispense Refill Last Dose   aspirin EC 81 MG  tablet Take 1 tablet (81 mg total) by mouth daily. Take after 12 weeks for prevention of preeclampssia later in pregnancy 300 tablet 2    Blood Pressure Monitoring DEVI 1 each by Does not apply route once a week. (Patient not taking: Reported on 09/28/2022) 1 each 0    Misc. Devices (GOJJI WEIGHT SCALE) MISC 1 each by Does not apply route once a week. (Patient not taking: Reported on 09/28/2022) 1 each 0    Prenatal MV & Min w/FA-DHA (PRENATAL GUMMIES) 0.18-25 MG CHEW Chew by mouth.        Review of Systems   All systems reviewed and negative except as stated in HPI  Blood pressure 135/75, pulse 83, temperature 97.7 F (36.5 C), temperature source Oral, resp. rate 18, last menstrual period 05/11/2022, unknown if currently breastfeeding. General appearance: alert, in no acute distress Lungs: normal work of breathing Heart: normal rate Presentation: cephalic, visualized Fetal monitoring: cat 1 Uterine activity:  determined by symptoms of painful contractions  Dilation: 10 Effacement (%): 90 Station: 0 Exam by:: Janus Molder MD   Prenatal labs: ABO, Rh: --/--/B POS (03/26 0950) Antibody: NEG (03/26 0950) Rubella: 12.70 (11/10 1021) RPR: Non Reactive (01/11 0823)  HBsAg: Negative (11/10 1021)  HIV: Non Reactive (01/11 0823)  GBS: Positive/-- (03/14 1533)  2 hr Glucola 84/99/62 Genetic screening  NIPS: Low risk female; AFP: Neg; Horizon: Silent carrier Alpha Thal [x] Partner kit given--error. Does not wish to try again Anatomy Engineer, building services normal except for EIF   Prenatal Transfer Tool  Maternal Diabetes: No Genetic Screening: Normal Maternal Ultrasounds/Referrals: Isolated EIF (echogenic intracardiac focus) Fetal Ultrasounds or other Referrals:  None amniocentesis offered - declined Maternal Substance Abuse:  No Significant Maternal Medications:  None Significant Maternal Lab Results:  Group B Strep positive Number of Prenatal Visits:greater than 3 verified prenatal visits Other  Comments:  None  Results for orders placed or performed during the hospital encounter of 02/06/23 (from the past 24 hour(s))  CBC   Collection Time: 02/06/23  9:50 AM  Result Value Ref Range   WBC 6.3 4.0 - 10.5 K/uL   RBC 4.76 3.87 - 5.11 MIL/uL   Hemoglobin 13.3 12.0 - 15.0 g/dL   HCT 39.8 36.0 - 46.0 %   MCV 83.6 80.0 - 100.0 fL   MCH 27.9 26.0 - 34.0 pg   MCHC 33.4 30.0 - 36.0 g/dL   RDW 13.0 11.5 - 15.5 %   Platelets 239 150 - 400 K/uL   nRBC 0.0 0.0 - 0.2 %  Type and screen [place]   Collection Time: 02/06/23  9:50 AM  Result Value Ref Range   ABO/RH(D) B POS    Antibody Screen NEG    Sample Expiration      02/09/2023,2359 Performed at [facility], [place] [address], [place], [place] [address]     Patient Active Problem List   Diagnosis Date Noted   Spontaneous onset of labor after 30 but before 36 completed weeks gestation with delivery by planned cesarean section 02/06/2023   Group B Streptococcus carrier, antepartum 02/01/2023   Transient elevated blood pressure 01/04/2023   Supervision of other high risk pregnancy, antepartum 09/22/2022   Short interval between pregnancies affecting pregnancy in second trimester, antepartum 09/12/2022   AMA (advanced maternal age) multigravida 35+, second trimester 09/12/2022    Assessment/Plan:  Sara Barrett is a 37 y.o. G3P2002 at [redacted]w[redacted]d here for SOL. She is complete and ready to push  #Labor:expectant management #Pain: unmedicated #FWB: Cat 1 #ID:  GBS pos - given 1x ampicillin #MOF: breast #MOC: s/p vasectomy #Circ:  N/A  Seen and discussed with Dr. Naaman Plummer [place]ott. Jiayu "[name], M.D. PGY-2 Family Medicine Visiting Resident Faculty Practice 02/06/2023 11:06 AM   GME ATTESTATION:  I saw and evaluated the patient. I agree with the findings and the plan of care as documented in the resident's note. I have made changes to documentation as necessary.  Gerlene Fee, DO OB Fellow,  [place] for [place] 02/06/2023, 11:26 AM

## 2023-02-06 NOTE — MAU Provider Note (Signed)
Sara Barrett is a 37 y.o. female presenting for SOL. She has a low risk pregnancy with a history of 2SVD with rapid labors. Labor started at 645.  OB History     Gravida  3   Para  2   Term  2   Preterm      AB      Living  2      SAB      IAB      Ectopic      Multiple      Live Births  2          Past Medical History:  Diagnosis Date   No pertinent past medical history    Vaginal Pap smear, abnormal    Past Surgical History:  Procedure Laterality Date   denies surgical history     WISDOM TOOTH EXTRACTION     Family History: family history includes Cancer (age of onset: 53) in her mother; Diabetes in her brother and father; Heart attack in her father; Heart disease in her mother. Social History:  reports that she has never smoked. She has never used smokeless tobacco. She reports current alcohol use. She reports that she does not use drugs.     Maternal Diabetes: No Genetic Screening: Normal Maternal Ultrasounds/Referrals: Normal Fetal Ultrasounds or other Referrals:  None Maternal Substance Abuse:  No Significant Maternal Medications:  None Significant Maternal Lab Results:  Group B Strep positive Number of Prenatal Visits:greater than 3 verified prenatal visits Other Comments:  None  Review of Systems History   Blood pressure 135/75, pulse 83, last menstrual period 05/11/2022, unknown if currently breastfeeding. Exam Physical Exam Vitals and nursing note reviewed.  Constitutional:      Appearance: Normal appearance.  HENT:     Head: Normocephalic and atraumatic.  Cardiovascular:     Rate and Rhythm: Normal rate and regular rhythm.  Pulmonary:     Effort: Pulmonary effort is normal.     Breath sounds: Normal breath sounds.  Abdominal:     General: Abdomen is flat.     Palpations: Abdomen is soft.  Skin:    General: Skin is warm and dry.     Capillary Refill: Capillary refill takes less than 2 seconds.  Neurological:     General: No  focal deficit present.     Mental Status: She is alert.  Psychiatric:        Mood and Affect: Mood normal.        Behavior: Behavior normal.        Thought Content: Thought content normal.        Judgment: Judgment normal.     Prenatal labs: ABO, Rh: B/Positive/-- (11/10 1021) Antibody: Negative (11/10 1021) Rubella: 12.70 (11/10 1021) RPR: Non Reactive (01/11 0823)  HBsAg: Negative (11/10 1021)  HIV: Non Reactive (01/11 [id number])  GBS: Positive/-- (03/14 1533)   Assessment/Plan: Desires waterbirth - CNM notified. Plans to breastfeed Husband plans on vasectomy for contraceptoin.  GBS positive - will give ampicillin for advanced labor. Expectant management. Anticipate vaginal delivery.   Truett Mainland 02/06/2023, 9:49 AM

## 2023-02-06 NOTE — Discharge Summary (Signed)
Postpartum Discharge Summary     Patient Name: Sara Barrett DOB: 1986-03-27 MRN: [id number]  Date of admission: 02/06/2023 Delivery date:02/06/2023  Delivering provider: Gerlene Fee  Date of discharge: 02/08/2023  Admitting diagnosis: Spontaneous onset of labor after 6 but before 85 completed weeks gestation with delivery by planned cesarean section [O75.82] Intrauterine pregnancy: [redacted]w[redacted]d     Secondary diagnosis:  Principal Problem:   Vaginal delivery Active Problems:   Short interval between pregnancies affecting pregnancy in second trimester, antepartum   AMA (advanced maternal age) multigravida 35+, second trimester   Supervision of other high risk pregnancy, antepartum   Group B Streptococcus carrier, antepartum   Spontaneous onset of labor after 32 but before 44 completed weeks gestation with delivery by planned cesarean section  Additional problems: n/a    Discharge diagnosis: Term Pregnancy Delivered                                              Post partum procedures: n/a Augmentation: N/A Complications: None  Hospital course: Onset of Labor With Vaginal Delivery      37 y.o. yo G3P3003 at [redacted]w[redacted]d was admitted in Active Labor on 02/06/2023. Labor course was complicated by precipitous delivery.   Membrane Rupture Time/Date: 10:33 AM ,02/06/2023   Delivery Method:Vaginal, Spontaneous  Episiotomy: None  Lacerations:  None  Patient had a postpartum course was uncomplicated.  She is ambulating, tolerating a regular diet, passing flatus, and urinating well. Patient is discharged home in stable condition on 02/08/23.  Newborn Data: Birth date:02/06/2023  Birth time:10:33 AM  Gender:Female  Living status:Living  Apgars:8 ,9  Weight:2660 g   Magnesium Sulfate received: No BMZ received: No Rhophylac:No MMR:No T-DaP:Given prenatally Flu: No Transfusion:No  Physical exam  Vitals:   02/07/23 0535 02/07/23 1300 02/07/23 2051 02/08/23 0600  BP: 124/66 127/84 127/69  112/62  Pulse: 62 69 69 64  Resp: 16 17 16 18   Temp: 97.9 F (36.6 C) 98.5 F (36.9 C) (!) 97.4 F (36.3 C) 97.7 F (36.5 C)  TempSrc: Oral Oral Oral Oral  SpO2: 100% 100% 100% 100%   General: alert, cooperative, and no distress Lochia: appropriate Uterine Fundus: firm Incision: N/A DVT Evaluation: No evidence of DVT seen on physical exam. Labs: Lab Results  Component Value Date   WBC 6.3 02/06/2023   HGB 13.3 02/06/2023   HCT 39.8 02/06/2023   MCV 83.6 02/06/2023   PLT 239 02/06/2023      Latest Ref Rng & Units 04/07/2017   10:21 AM  CMP  Glucose 65 - 99 mg/dL 80   BUN 6 - 20 mg/dL 8   Creatinine 0.57 - 1.00 mg/dL 0.79   Sodium 134 - 144 mmol/L 138   Potassium 3.5 - 5.2 mmol/L 4.7   Chloride 96 - 106 mmol/L 103   CO2 18 - 29 mmol/L 23   Calcium 8.7 - 10.2 mg/dL 9.1   Total Protein 6.0 - 8.5 g/dL 6.7   Total Bilirubin 0.0 - 1.2 mg/dL 0.4   Alkaline Phos 39 - 117 IU/L 70   AST 0 - 40 IU/L 21   ALT 0 - 32 IU/L 19    Edinburgh Score:    02/06/2023   12:43 PM  Edinburgh Postnatal Depression Scale Screening Tool  I have been able to laugh and see the funny side of things. 0  I  have looked forward with enjoyment to things. 0  I have blamed myself unnecessarily when things went wrong. 0  I have been anxious or worried for no good reason. 0  I have felt scared or panicky for no good reason. 0  Things have been getting on top of me. 0  I have been so unhappy that I have had difficulty sleeping. 0  I have felt sad or miserable. 0  I have been so unhappy that I have been crying. 0  The thought of harming myself has occurred to me. 0  Edinburgh Postnatal Depression Scale Total 0   After visit meds:  Allergies as of 02/08/2023   No Known Allergies      Medication List     STOP taking these medications    aspirin EC 81 MG tablet       TAKE these medications    acetaminophen 325 MG tablet Commonly known as: Tylenol Take 2 tablets (650 mg total) by mouth  every 4 (four) hours as needed (for pain scale < 4).   ibuprofen 600 MG tablet Commonly known as: ADVIL Take 1 tablet (600 mg total) by mouth every 6 (six) hours.   Prenatal Gummies 0.18-25 MG Chew Chew by mouth.       Discharge home in stable condition Infant Feeding: Breast Infant Disposition:home with mother Discharge instruction: per After Visit Summary and Postpartum booklet. Activity: Advance as tolerated. Pelvic rest for 6 weeks.  Diet: routine diet Future Appointments: Future Appointments  Date Time Provider [place]  03/21/2023  3:55 PM Anyanwu, Sallyanne Havers, MD [facility]   Follow up Visit:  Message sent to [facility] by Autry-Lott on 02/08/2023  Please schedule this patient for a Virtual postpartum visit in 6 weeks with the following provider: Any provider. Additional Postpartum F/U: n/a   High risk pregnancy complicated by:  AMA, short interval pregnancy Delivery mode:  Vaginal, Spontaneous  Anticipated Birth Control:   Vasectomy  02/08/2023 Simone Autry-Lott, DO

## 2023-02-06 NOTE — Lactation Note (Signed)
This note was copied from a baby's chart. Lactation Consultation Note  Patient Name: Sara Barrett [id number] Date: 02/06/2023 Age:36 hours Reason for consult: Initial assessment;Early term 37-38.6wks;Infant < 6lbs;Breastfeeding assistance The beginning of the consult baby was not ready to feed . Elloree set up the DEBP and had mom pump for 15 mins , with no EBM Yield.  Baby awake , LC checked the diaper and changed a  stool and small wet . LC assisted to latch on the right breast football, areola compressible and depth obtained. Baby still feeding at 9 mins few swallows and per mom comfortable. Latch score 8  LC reviewed the feeding plan for a early term infant , less than 6 pounds. LC recommended for now since the has fed well since birth to just feed the baby at the breast.  If the baby doesn't feed at the breast will need to be supplemented with donor milk or formula and the mom will need to be asked.   Maternal Data Has patient been taught Hand Expression?: Yes (mom able to hand express , glistening) Does the patient have breastfeeding experience prior to this delivery?: Yes How long did the patient breastfeed?: per mom 1st 16 months , 2nd baby 10 1/2 months  Feeding Mother's Current Feeding Choice: Breast Milk  LATCH Score Latch: Grasps breast easily, tongue down, lips flanged, rhythmical sucking.  Audible Swallowing: A few with stimulation  Type of Nipple: Everted at rest and after stimulation  Comfort (Breast/Nipple): Soft / non-tender  Hold (Positioning): Assistance needed to correctly position infant at breast and maintain latch.  LATCH Score: 8   Lactation Tools Discussed/Used Tools: Pump;Flanges Flange Size: 24 Breast pump type: Double-Electric Breast Pump Pump Education: Milk Storage;Setup, frequency, and cleaning Reason for Pumping: ET , less than 6 pounds  Interventions Interventions: Breast feeding basics reviewed;Assisted with latch;Skin to skin;Breast  massage;Hand express;Breast compression;Adjust position;Support pillows;Position options;Education;DEBP;LC Services brochure;LPT handout/interventions  Discharge Pump: Personal (ordered from [employer])  Consult Status Consult Status: Follow-up Date: 02/07/23 Follow-up type: In-patient    [place] 02/06/2023, 6:32 PM

## 2023-02-06 NOTE — MAU Note (Signed)
Sara Barrett is a 37 y.o. at [redacted]w[redacted]d here in MAU reporting: contractions that started at 0645. Denies any LOF or VB and endorses +FM. Denies any complications with pregnancy.   Onset of complaint: 0645 Pain score: 8 Vitals:   02/06/23 0947  BP: 135/75  Pulse: 83  Resp: 18  Temp: 97.7 F (36.5 C)     FHT:140 Lab orders placed from triage:  n/a

## 2023-02-07 ENCOUNTER — Encounter: Payer: Self-pay | Admitting: *Deleted

## 2023-02-07 NOTE — Lactation Note (Signed)
This note was copied from a baby's chart. Lactation Consultation Note  Patient Name: Sara Barrett [id number] Date: 02/07/2023 Age:36 hours Reason for consult: Early term 37-38.6wks;Infant weight loss (Infant weight loss of -5.64%)  P3, ETI female infant. Per Birth Parent infant is breastfeeding most feedings for 10 to 15 minutes. LC did not observe latch infant recently BF and Support Person is holding sleeping infant. Per Birth Parent, she just recently pumped 10 mls of EBM from pumping and hand expression. Birth Parent has pumped twice today but plans to pump every 3 hours now that she had some rest and is feeding better today.  Birth Parent plans to continue to latch infant each feeding and afterwards will start supplementing infant with her pumped EBM to help stabilize infant's weight loss. LC gave Breastfeeding supplemental sheet and Birth Parent knows on day 2 after latching infant at breast to offer infant (7-12 mls) or more per feeding.    Birth Parent is experienced with breastfeeding she BF 1st child who is 20 years old for 17 months and 2nd child she stopped BF at 101/2 months due milk drying up and her 2nd child is currently 50 months old.  Birth Parent current feeding plan:  1 Continue to BF infant 8 to 12+ times within 24 hours, Skin to skin and afterwards supplement infant with EBM that is pumped. 2- Birth Parent will continue to use DEBP every 3 hours for 15 minutes on initial setting. 3- Birth Parent will call RN/LC if their are any BF questions, concerns or if latch assistance is needed.  Maternal Data    Feeding Mother's Current Feeding Choice: Breast Milk  LATCH Score                    Lactation Tools Discussed/Used Breast pump type: Double-Electric Breast Pump Pump Education: Setup, frequency, and cleaning;Milk Storage Reason for Pumping: ETI infant with high weight loss 1st 24 hours Pumping frequency: Birth Parent will continue to pump every 3 hours  for 15 minutes on inital setting. Pumped volume: 10 mL  Interventions Interventions: Skin to skin;Education;Pace feeding;Breast compression;Breast massage  Discharge    Consult Status Consult Status: Follow-up Date: 02/08/23 Follow-up type: In-patient    Eulis Canner 02/07/2023, 6:33 PM

## 2023-02-07 NOTE — Progress Notes (Addendum)
POSTPARTUM PROGRESS NOTE  Post Partum Day 1  Subjective:  Sara Barrett is a 37 y.o. G3P3003 s/p SVD at [redacted]w[redacted]d.  She reports she is doing well. No acute events overnight. She denies any problems with ambulating, voiding or po intake. Denies nausea or vomiting. Has had 1x BM. Has not ambulate much. Pain is controlled with ibuprofen and tylenol.  Lochia is moderate, decreasing.  Objective: Blood pressure 124/66, pulse 62, temperature 97.9 F (36.6 C), temperature source Oral, resp. rate 16, last menstrual period 05/11/2022, SpO2 100 %, unknown if currently breastfeeding.  Physical Exam:  General: alert, cooperative and no distress Chest: no respiratory distress Heart:regular rate, distal pulses intact Uterine Fundus: firm, appropriately tender, 1cm below Umbilicus DVT Evaluation: No calf swelling or tenderness Extremities: no edema Skin: warm, dry  Recent Labs    02/06/23 0950  HGB 13.3  HCT 39.8    Assessment/Plan: Perlean Flud is a 37 y.o. [id number] s/p SVD at [redacted]w[redacted]d   PPD#1 - Doing well  Routine postpartum care  Contraception: vasectomy Feeding: breast Dispo: Plan for discharge tomorrow.   LOS: 1 day    [name] "[name], M.D. PGY-2 Family Medicine Visiting Resident [facility] 02/07/2023 7:42 AM

## 2023-02-08 ENCOUNTER — Encounter: Payer: Medicaid Other | Admitting: Advanced Practice Midwife

## 2023-02-08 MED ORDER — IBUPROFEN 600 MG PO TABS: 600.0000 mg | ORAL_TABLET | Freq: Four times a day (QID) | ORAL | 0 refills | Status: DC

## 2023-02-08 MED ORDER — ACETAMINOPHEN 325 MG PO TABS: 650.0000 mg | ORAL_TABLET | ORAL | 0 refills | Status: DC | PRN

## 2023-02-08 NOTE — Lactation Note (Signed)
This note was copied from a baby's chart. Lactation Consultation Note  Patient Name: Sara Barrett [id number] Date: 02/08/2023 Age:36 hours  Reason for consult: Follow-up assessment;Early term 37-38.6wks;Infant < 6lbs  Mother has exclusively breast fed her baby. She has pumped 10-18 ml of expressed milk. She has mild nipple soreness and skin is intact. Coconut oil given.   Denies any questions or concerns. She has experience with breastfeeding. Mom made aware of O/P services, breastfeeding support groups, and our phone # for post-discharge questions.     Maternal Data Has patient been taught Hand Expression?: Yes Does the patient have breastfeeding experience prior to this delivery?: Yes  Feeding Mother's Current Feeding Choice: Breast Milk  Interventions Interventions: Education;Coconut oil  Discharge Pump: Personal;Manual  Consult Status Consult Status: Complete Date: 02/08/23    Gwenevere Abbot 02/08/2023, 10:48 AM

## 2023-02-08 NOTE — Discharge Instructions (Signed)
WHAT TO LOOK OUT FOR: Fever of 100.4 or above Mastitis: feels like flu and breasts hurt Infection: increased pain, swelling or redness Blood clots golf ball size or larger Postpartum depression   Congratulations on your newest addition!

## 2023-02-13 ENCOUNTER — Encounter: Payer: Self-pay | Admitting: Advanced Practice Midwife

## 2023-02-15 ENCOUNTER — Telehealth (HOSPITAL_COMMUNITY): Payer: Self-pay

## 2023-02-15 NOTE — Telephone Encounter (Signed)
Chart review.

## 2023-02-17 ENCOUNTER — Telehealth (HOSPITAL_COMMUNITY): Payer: Self-pay

## 2023-02-17 ENCOUNTER — Telehealth (HOSPITAL_COMMUNITY): Payer: Self-pay | Admitting: *Deleted

## 2023-02-17 DIAGNOSIS — Z1331 Encounter for screening for depression: Secondary | ICD-10-CM

## 2023-02-17 NOTE — Telephone Encounter (Signed)
Return call placed to patient. EPDS completed. Patient scored a 6 with a (+) answer of 1 (hardly ever) to #10. Patient reported that the thoughts of harming herself were brief. Stated, "It was just one day where I got really overwhelmed." Reported that her husband took care of the children and she went for a drive for "several hours." RN praised patient for recognizing her own need to get away and for requesting help for husband. Patient denied suicidal / homicidal ideation at this time. Per protocol, RN notified Dr. Earlene Plater of patient's EPDS score and placed a referral to Integrated Behavioral Health. Deforest Hoyles, RN, [phone], 11/18/22

## 2023-02-17 NOTE — Telephone Encounter (Signed)
Patient reports feeling good. Patient declines questions/concerns about her health and healing.  Patient reports that baby is doing well. Eating, peeing/pooping, and gaining weight well. Baby sleeps in a bassinet. RN reviewed ABC's of safe sleep with patient. Patient declines any questions or concerns about baby.  EPDS- Would like a call back at a later time to complete.   Suann [facility]  Perinatal Services   02/17/23,1138

## 2023-02-26 NOTE — BH Specialist Note (Signed)
Integrated Behavioral Health via Telemedicine Visit  03/07/2023 Sara Barrett [id number]  Number of Integrated Behavioral Health Clinician visits: 1- Initial Visit  Session Start time: 1428   Session End time: 1507  Total time in minutes: 39   Referring Provider: Leroy Libman, MD Patient/Family location: Home [facility] Provider location: Center for [facility]  All persons participating in visit: Patient Sara Barrett and [facility] Argueta   Types of Service: Individual psychotherapy and Video visit  I connected with [name] and/or [name]  n/a  via  Telephone or Video Enabled Telemedicine Application  (Video is Caregility application) and verified that I am speaking with the correct person using two identifiers. Discussed confidentiality: Yes   I discussed the limitations of telemedicine and the availability of in person appointments.  Discussed there is a possibility of technology failure and discussed alternative modes of communication if that failure occurs.  I discussed that engaging in this telemedicine visit, they consent to the provision of behavioral healthcare and the services will be billed under their insurance.  Patient and/or legal guardian expressed understanding and consented to Telemedicine visit: Yes   Presenting Concerns: Patient and/or family reports the following symptoms/concerns: Overwhelmed, anxious, restless and irritable; processing positive birthing experience, after not being able to have waterbirth. Pt would like to be able to "prep and get organized" and have moments of quiet; open to implement self-coping strategy to help reduce feelings of irritability. Good support at home and from neighborhood mom group support.  Duration of problem: Increase postpartum; Severity of problem: moderate  Patient and/or Family's Strengths/Protective Factors: Social connections, Concrete supports in place  (healthy food, safe environments, etc.), Sense of purpose, and Physical Health (exercise, healthy diet, medication compliance, etc.)  Goals Addressed: Patient will:  Reduce symptoms of: anxiety and stress   Increase knowledge and/or ability of: self-management skills   Demonstrate ability to: Increase healthy adjustment to current life circumstances  Progress towards Goals: Ongoing  Interventions: Interventions utilized:  Mindfulness or Management consultant and Psychoeducation and/or Health Education Standardized Assessments completed: GAD-7 and PHQ 9  Patient and/or Family Response: Patient agrees with treatment plan.   Assessment: Patient currently experiencing Adjustment disorder with anxiety  Patient may benefit from psychoeducation and brief therapeutic interventions regarding coping with symptoms of anxiety and current life stress .  Plan: Follow up with behavioral health clinician on : Two weeks Behavioral recommendations:  -Continue attending [facility] mom support group for as long as remains supportive -CALM relaxation breathing exercise twice daily (morning; at bedtime); as needed throughout the day as needed -Consider obtaining headphones, to use for blocking out loud sounds/increasing peaceful moments as needed -Continue prioritizing healthy self-care (regular meals, adequate rest; allowing practical help from supportive friends and family) until at least postpartum medical appointment   Referral(s): Integrated [employer] (In Clinic)  I discussed the assessment and treatment plan with the patient and/or parent/guardian. They were provided an opportunity to ask questions and all were answered. They agreed with the plan and demonstrated an understanding of the instructions.   They were advised to call back or seek an in-person evaluation if the symptoms worsen or if the condition fails to improve as anticipated.  Rae Lips, LCSW     03/07/2023    2:37 PM 09/22/2022    8:45 AM 04/07/2017    9:39 AM  Depression screen PHQ 2/9  Decreased Interest 0 0 0  Down, Depressed,  Hopeless 1 0 0  PHQ - 2 Score 1 0 0  Altered sleeping 0 0   Tired, decreased energy 1 0   Change in appetite 1 0   Feeling bad or failure about yourself  0 0   Trouble concentrating 0 0   Moving slowly or fidgety/restless 0 0   Suicidal thoughts 0 0   PHQ-9 Score 3 0       03/07/2023    2:41 PM 09/22/2022    8:45 AM  GAD 7 : Generalized Anxiety Score  Nervous, Anxious, on Edge 1 0  Control/stop worrying 0 0  Worry too much - different things 0 0  Trouble relaxing 0 0  Restless 2 0  Easily annoyed or irritable 3 0  Afraid - awful might happen 0 0  Total GAD 7 Score 6 0

## 2023-03-07 ENCOUNTER — Ambulatory Visit (INDEPENDENT_AMBULATORY_CARE_PROVIDER_SITE_OTHER): Payer: Medicaid Other | Admitting: Clinical

## 2023-03-07 DIAGNOSIS — F4322 Adjustment disorder with anxiety: Secondary | ICD-10-CM

## 2023-03-07 NOTE — Patient Instructions (Signed)
[facility] [address] [place], [place] [address] [phone] (main office) [phone] (Juel Ripley's office)   

## 2023-03-09 NOTE — BH Specialist Note (Signed)
Integrated Behavioral Health via Telemedicine Visit  03/09/2023 Tuesdae Sentman [id number]  Number of Integrated Behavioral Health Clinician visits: 1- Initial Visit  Session Start time: 1428   Session End time: 1507  Total time in minutes: 39   Referring Provider: Leroy Libman, MD Patient/Family location: Home [facility] Provider location: Center for [facility]  All persons participating in visit: Patient Sara Barrett and [facility] Avamae Dehaan   Types of Service: Individual psychotherapy and Video visit  I connected with [name] and/or [name]  n/a  via  Telephone or Video Enabled Telemedicine Application  (Video is Caregility application) and verified that I am speaking with the correct person using two identifiers. Discussed confidentiality: Yes   I discussed the limitations of telemedicine and the availability of in person appointments.  Discussed there is a possibility of technology failure and discussed alternative modes of communication if that failure occurs.  I discussed that engaging in this telemedicine visit, they consent to the provision of behavioral healthcare and the services will be billed under their insurance.  Patient and/or legal guardian expressed understanding and consented to Telemedicine visit: Yes   Presenting Concerns: Patient and/or family reports the following symptoms/concerns: Noticing mood improvement/less irritability with daily self-coping strategies; on day 3 of "21 days of discipline" (prayer, Bible reading, 30 minute walk, relaxation breathing; tidying up as needed daily).  Duration of problem: postpartum; Severity of problem: mild  Patient and/or Family's Strengths/Protective Factors: Social connections, Social and Emotional competence, Concrete supports in place (healthy food, safe environments, etc.), Sense of purpose, and Physical Health (exercise, healthy diet, medication compliance,  etc.)  Goals Addressed: Patient will:  Maintain reduction of symptoms of: anxiety and depression    Demonstrate ability to: Increase healthy adjustment to current life circumstances  Progress towards Goals: Ongoing  Interventions: Interventions utilized:  Supportive Reflection Standardized Assessments completed: Not Needed  Patient and/or Family Response: Patient agrees with treatment plan.   Assessment: Patient currently experiencing Adjustment disorder with anxious mood.   Patient may benefit from continued therapeutic intervention today.  Plan: Follow up with behavioral health clinician on : Call Allyanna Appleman at [phone], as needed. Behavioral recommendations:  -Continue daily self-coping strategies (prayer, Bible reading, 30 minute walk at least 5 days/week, relaxation breathing; tidying up throughout the day for peace of mind), with focus on prioritizing next 18 days -Continue attending mom support group Referral(s): Integrated [employer] (In Clinic)  I discussed the assessment and treatment plan with the patient and/or parent/guardian. They were provided an opportunity to ask questions and all were answered. They agreed with the plan and demonstrated an understanding of the instructions.   They were advised to call back or seek an in-person evaluation if the symptoms worsen or if the condition fails to improve as anticipated.  Rae Lips, LCSW     03/07/2023    2:37 PM 09/22/2022    8:45 AM 04/07/2017    9:39 AM  Depression screen PHQ 2/9  Decreased Interest 0 0 0  Down, Depressed, Hopeless 1 0 0  PHQ - 2 Score 1 0 0  Altered sleeping 0 0   Tired, decreased energy 1 0   Change in appetite 1 0   Feeling bad or failure about yourself  0 0   Trouble concentrating 0 0   Moving slowly or fidgety/restless 0 0   Suicidal thoughts 0 0   PHQ-9 Score 3 0       03/07/2023  2:41 PM 09/22/2022    8:45 AM  GAD 7 : Generalized Anxiety Score  Nervous,  Anxious, on Edge 1 0  Control/stop worrying 0 0  Worry too much - different things 0 0  Trouble relaxing 0 0  Restless 2 0  Easily annoyed or irritable 3 0  Afraid - awful might happen 0 0  Total GAD 7 Score 6 0

## 2023-03-21 ENCOUNTER — Telehealth: Payer: Medicaid Other | Admitting: Obstetrics & Gynecology

## 2023-03-21 ENCOUNTER — Telehealth (INDEPENDENT_AMBULATORY_CARE_PROVIDER_SITE_OTHER): Payer: Medicaid Other | Admitting: Obstetrics and Gynecology

## 2023-03-21 ENCOUNTER — Ambulatory Visit (INDEPENDENT_AMBULATORY_CARE_PROVIDER_SITE_OTHER): Payer: Medicaid Other | Admitting: Clinical

## 2023-03-21 ENCOUNTER — Encounter: Payer: Self-pay | Admitting: Obstetrics and Gynecology

## 2023-03-21 DIAGNOSIS — F4322 Adjustment disorder with anxiety: Secondary | ICD-10-CM

## 2023-03-21 NOTE — Progress Notes (Signed)
   Provider location: [facility]   Patient location: Home  I connected withNAME@ on 03/21/23 at  1:55 PM EDT by Mychart Video Encounter and verified that I am speaking with the correct person using two identifiers.       I discussed the limitations, risks, security and privacy concerns of performing an evaluation and management service virtually and the availability of in person appointments. I also discussed with the patient that there may be a patient responsible charge related to this service. The patient expressed understanding and agreed to proceed.  Post Partum Visit Note Subjective:   Sara Barrett is a 37 y.o. G49P3003 female who presents for a postpartum visit. She is 6 weeks postpartum following a normal spontaneous vaginal delivery.  I have fully reviewed the prenatal and intrapartum course. The delivery was at 38/5 gestational weeks.  Anesthesia: none. Postpartum course has been unremarkable. Baby is doing well. Baby is feeding by breast. Bleeding no bleeding. Bowel function is normal. Bladder function is normal. Patient is sexually active. Contraception method is vasectomy. Postpartum depression screening: negative.   The pregnancy intention screening data noted above was reviewed. Potential methods of contraception were discussed. The patient elected to proceed with No data recorded.    Medical record  Review of Systems Pertinent items noted in HPI and remainder of comprehensive ROS otherwise negative.  Objective:  LMP 05/11/2022     General:  Alert, oriented and cooperative. Patient is in no acute distress.  Respiratory: Normal respiratory effort, no problems with respiration noted  Mental Status: Normal mood and affect. Normal behavior. Normal judgment and thought content.  Rest of physical exam deferred due to type of encounter   Assessment:    Nl postpartum exam.  Plan:  Essential components of care per ACOG  recommendations:  1.  Mood and well being: Patient with negative depression screening today. Reviewed local resources for support.  - Patient does not use tobacco.  - hx of drug use? No    2. Infant care and feeding:  -Patient currently breastmilk feeding? Yes If breastmilk feeding discussed return to work and pumping. If needed, patient was provided letter for work to allow for every 2-3 hr pumping breaks, and to be granted a private location to express breastmilk and refrigerated area to store breastmilk. Reviewed importance of draining breast regularly to support lactation. -Social determinants of health (SDOH) reviewed in EPIC. No concerns  3. Sexuality, contraception and birth spacing - Patient does not want a pregnancy in the next year.  Desired family size is completed  - Reviewed forms of contraception in tiered fashion. Husband has had vasectomy  - Discussed birth spacing of 18 months  4. Sleep and fatigue -Encouraged family/partner/community support of 4 hrs of uninterrupted sleep to help with mood and fatigue  5. Physical Recovery  - Discussed patients delivery and complications  - Perineal healing reviewed. Patient expressed understanding - Patient has urinary incontinence? No - Patient is safe to resume physical and sexual activity  6.  Health Maintenance - Last pap smear done 2018 and was normal with negative HPV.      I provided 8 minutes of face-to-face time during this encounter.    No follow-ups on file.  Future Appointments  Date Time Provider Department Center  03/21/2023  1:55 PM Hermina Staggers, MD [facility]    Sara Barrett, [facility] Center for [employer], [facility]

## 2023-03-21 NOTE — Patient Instructions (Signed)
[facility] [address] [place], [place] [address] [phone] (main office) [phone] (Skylee Baird's office)   

## 2023-07-11 ENCOUNTER — Encounter: Payer: Self-pay | Admitting: Advanced Practice Midwife

## 2023-07-18 ENCOUNTER — Encounter: Payer: Self-pay | Admitting: *Deleted

## 2023-08-03 ENCOUNTER — Encounter: Payer: Self-pay | Admitting: *Deleted

## 2023-09-05 ENCOUNTER — Other Ambulatory Visit: Payer: Self-pay | Admitting: Physician Assistant

## 2023-09-05 DIAGNOSIS — Z1231 Encounter for screening mammogram for malignant neoplasm of breast: Secondary | ICD-10-CM

## 2023-09-28 ENCOUNTER — Ambulatory Visit: Payer: Medicaid Other

## 2023-10-24 ENCOUNTER — Ambulatory Visit
Admission: RE | Admit: 2023-10-24 | Discharge: 2023-10-24 | Disposition: A | Discharge status: Home / Self Care | Payer: Medicaid Other | Source: Ambulatory Visit | Attending: Physician Assistant | Admitting: Physician Assistant

## 2023-10-24 DIAGNOSIS — Z1231 Encounter for screening mammogram for malignant neoplasm of breast: Secondary | ICD-10-CM

## 2024-09-10 ENCOUNTER — Ambulatory Visit (INDEPENDENT_AMBULATORY_CARE_PROVIDER_SITE_OTHER)

## 2024-09-10 DIAGNOSIS — L853 Xerosis cutis: Secondary | ICD-10-CM | POA: Diagnosis not present

## 2024-09-10 DIAGNOSIS — L989 Disorder of the skin and subcutaneous tissue, unspecified: Secondary | ICD-10-CM | POA: Diagnosis not present

## 2024-09-10 DIAGNOSIS — L84 Corns and callosities: Secondary | ICD-10-CM | POA: Diagnosis not present

## 2024-09-10 NOTE — Progress Notes (Signed)
  Subjective:  Patient ID: Sara Barrett, female    DOB: 1986-02-24,  MRN: [id number]  Chief Complaint  Patient presents with   Foot Problem    Rm2 Patient complains of dry skin bilateral feet that crack and peel/ no pain/no treatment/not diabetic.    38 y.o. female presents with the above complaint.  She relates to a history of dry skin on her feet that can cause them to crack.  She recently experienced a small crack to the lateral aspect of her left foot.  She consistently gets hyperkeratotic lesions to the plantar aspect of her left fifth metatarsophalangeal joint.  She denies any other pedal pathology.  Review of Systems: Negative except as noted in the HPI. Denies N/V/F/Ch.  Past Medical History:  Diagnosis Date   No pertinent past medical history    Vaginal Pap smear, abnormal    No current outpatient medications on file.  Social History   Tobacco Use  Smoking Status Never  Smokeless Tobacco Never    No Known Allergies Objective:  There were no vitals filed for this visit. There is no height or weight on file to calculate BMI. Constitutional Well developed. Well nourished. Oriented to person, place, and time.  Vascular Dorsalis pedis pulses palpable bilaterally. Posterior tibial pulses palpable bilaterally. Capillary refill normal to all digits.  No cyanosis or clubbing noted. Pedal hair growth normal.  Neurologic Normal speech. Epicritic sensation to light touch grossly present bilaterally. Negative tinel sign at tarsal tunnel bilaterally.   Dermatologic Mild peeling skin on heel and lateral foot without erythema, edema or surrounding signs of infection.  Small crack partial-thickness through the dermis on the lateral aspect of the left foot without signs of infection.  Hyperkeratotic lesion without central core at the plantar left fifth metatarsophalangeal joint.  No open wound.  Musculoskeletal: 5 out of 5 muscle strength all major pedal muscle groups.  No  contributing deformity.  Major pedal joints range of motion within normal limits.    Assessment:   1. Benign skin lesion   2. Corns and callosities   3. Xerosis cutis    Plan:  - Patient was evaluated and treated and all questions answered.  Benign skin lesion - Discussed the diagnosis of hyperkeratotic skin lesion to the plantar aspect of her left fifth metatarsophalangeal joint.  She does express that it recurs in this area regularly.  She has not changed her shoe gear recently and states that it has developed regardless of shoe gear worn. - Skin lesion debrided sharply today utilizing a 312 blade without incident.  Patient expressed satisfaction. - Patient was dispensed offloading dancers pads today and instructed on use.  She will attempt these for the next few weeks and then return to clinic in 4 weeks.  If they are successful, we can consider custom orthotics for her to offload the area permanently  Xerosis - Discussed the diagnosis of xerosis/dry skin.  Recommended the use of lotion with 40% urea.  Patient states that she did have a lotion similar in the past that worked well, she has not used it recently.  She will acquire over-the-counter lotion with 40% urea.  RTC 4 weeks. Pre-clinical XR if metatarsal pads are effective, prior to orthotic dispense   Prentice Ovens, DPM AACFAS Fellowship Trained Podiatric Surgeon [facility]

## 2024-10-07 ENCOUNTER — Ambulatory Visit
